# Patient Record
Sex: Female | Born: 1990 | Race: White | Hispanic: No | Marital: Single | Smoking: Never smoker
Health system: Southern US, Community
[De-identification: ages and names within clinical notes are randomized; demographics above are authoritative.]

## PROBLEM LIST (undated history)

## (undated) DIAGNOSIS — Z8619 Personal history of other infectious and parasitic diseases: Secondary | ICD-10-CM

## (undated) DIAGNOSIS — D649 Anemia, unspecified: Secondary | ICD-10-CM

## (undated) DIAGNOSIS — N189 Chronic kidney disease, unspecified: Secondary | ICD-10-CM

## (undated) HISTORY — DX: Personal history of other infectious and parasitic diseases: Z86.19

## (undated) HISTORY — PX: KIDNEY SURGERY: SHX687

---

## 2013-08-26 ENCOUNTER — Emergency Department: Payer: Self-pay | Admitting: Emergency Medicine

## 2013-08-26 LAB — CBC WITH DIFFERENTIAL/PLATELET
BASOS ABS: 0 10*3/uL (ref 0.0–0.1)
BASOS PCT: 0.5 %
Eosinophil #: 0.1 10*3/uL (ref 0.0–0.7)
Eosinophil %: 1.3 %
HCT: 42.5 % (ref 35.0–47.0)
HGB: 14 g/dL (ref 12.0–16.0)
LYMPHS ABS: 1.3 10*3/uL (ref 1.0–3.6)
Lymphocyte %: 26 %
MCH: 29.3 pg (ref 26.0–34.0)
MCHC: 33 g/dL (ref 32.0–36.0)
MCV: 89 fL (ref 80–100)
Monocyte #: 0.8 x10 3/mm (ref 0.2–0.9)
Monocyte %: 16.3 %
Neutrophil #: 2.7 10*3/uL (ref 1.4–6.5)
Neutrophil %: 55.9 %
Platelet: 199 10*3/uL (ref 150–440)
RBC: 4.78 10*6/uL (ref 3.80–5.20)
RDW: 12.6 % (ref 11.5–14.5)
WBC: 4.9 10*3/uL (ref 3.6–11.0)

## 2013-08-26 LAB — URINALYSIS, COMPLETE
Bilirubin,UR: NEGATIVE
GLUCOSE, UR: NEGATIVE mg/dL (ref 0–75)
KETONE: NEGATIVE
Nitrite: NEGATIVE
Ph: 6 (ref 4.5–8.0)
Protein: NEGATIVE
RBC,UR: 33 /HPF (ref 0–5)
SPECIFIC GRAVITY: 1.006 (ref 1.003–1.030)
WBC UR: 43 /HPF (ref 0–5)

## 2013-08-26 LAB — COMPREHENSIVE METABOLIC PANEL
ALK PHOS: 80 U/L
Albumin: 3.8 g/dL (ref 3.4–5.0)
Anion Gap: 7 (ref 7–16)
BILIRUBIN TOTAL: 0.5 mg/dL (ref 0.2–1.0)
BUN: 8 mg/dL (ref 7–18)
CALCIUM: 9 mg/dL (ref 8.5–10.1)
CO2: 26 mmol/L (ref 21–32)
Chloride: 104 mmol/L (ref 98–107)
Creatinine: 1.07 mg/dL (ref 0.60–1.30)
EGFR (African American): >60
EGFR (Non-African Amer.): >60
GLUCOSE: 95 mg/dL (ref 65–99)
OSMOLALITY: 272 (ref 275–301)
POTASSIUM: 3.4 mmol/L — AB (ref 3.5–5.1)
SGOT(AST): 32 U/L (ref 15–37)
SGPT (ALT): 44 U/L
Sodium: 137 mmol/L (ref 136–145)
Total Protein: 8 g/dL (ref 6.4–8.2)

## 2013-08-27 LAB — URINE CULTURE

## 2013-09-15 ENCOUNTER — Ambulatory Visit (INDEPENDENT_AMBULATORY_CARE_PROVIDER_SITE_OTHER): Payer: BC Managed Care – PPO | Admitting: Adult Health

## 2013-09-15 ENCOUNTER — Encounter: Payer: Self-pay | Admitting: Adult Health

## 2013-09-15 VITALS — BP 119/84 | HR 92 | Temp 97.7°F | Resp 14 | Ht 66.0 in | Wt 185.0 lb

## 2013-09-15 DIAGNOSIS — Q600 Renal agenesis, unilateral: Secondary | ICD-10-CM

## 2013-09-15 DIAGNOSIS — Z0000 Encounter for general adult medical examination without abnormal findings: Secondary | ICD-10-CM

## 2013-09-15 DIAGNOSIS — Q605 Renal hypoplasia, unspecified: Secondary | ICD-10-CM

## 2013-09-15 DIAGNOSIS — Q602 Renal agenesis, unspecified: Secondary | ICD-10-CM

## 2013-09-15 DIAGNOSIS — Z3201 Encounter for pregnancy test, result positive: Secondary | ICD-10-CM

## 2013-09-15 DIAGNOSIS — N926 Irregular menstruation, unspecified: Secondary | ICD-10-CM

## 2013-09-15 LAB — COMPREHENSIVE METABOLIC PANEL
ALK PHOS: 66 U/L (ref 39–117)
ALT: 26 U/L (ref 0–35)
AST: 24 U/L (ref 0–37)
Albumin: 4.2 g/dL (ref 3.5–5.2)
BUN: 8 mg/dL (ref 6–23)
CO2: 23 mEq/L (ref 19–32)
CREATININE: 0.8 mg/dL (ref 0.4–1.2)
Calcium: 9.3 mg/dL (ref 8.4–10.5)
Chloride: 101 mEq/L (ref 96–112)
GFR: 94.08 mL/min (ref >60.00)
GLUCOSE: 74 mg/dL (ref 70–99)
Potassium: 4 mEq/L (ref 3.5–5.1)
SODIUM: 132 meq/L — AB (ref 135–145)
Total Bilirubin: 0.6 mg/dL (ref 0.2–1.2)
Total Protein: 7.5 g/dL (ref 6.0–8.3)

## 2013-09-15 LAB — LIPID PANEL
Cholesterol: 221 mg/dL — ABNORMAL HIGH (ref 0–200)
HDL: 50.1 mg/dL (ref >39.00)
LDL CALC: 158 mg/dL — AB (ref 0–99)
NONHDL: 170.9
Total CHOL/HDL Ratio: 4
Triglycerides: 66 mg/dL (ref 0.0–149.0)
VLDL: 13.2 mg/dL (ref 0.0–40.0)

## 2013-09-15 LAB — POCT URINE PREGNANCY: PREG TEST UR: POSITIVE

## 2013-09-15 LAB — CBC WITH DIFFERENTIAL/PLATELET
Basophils Absolute: 0 10*3/uL (ref 0.0–0.1)
Basophils Relative: 0.4 % (ref 0.0–3.0)
EOS PCT: 0.4 % (ref 0.0–5.0)
Eosinophils Absolute: 0 10*3/uL (ref 0.0–0.7)
HCT: 38.3 % (ref 36.0–46.0)
HEMOGLOBIN: 12.9 g/dL (ref 12.0–15.0)
LYMPHS PCT: 19.2 % (ref 12.0–46.0)
Lymphs Abs: 1.3 10*3/uL (ref 0.7–4.0)
MCHC: 33.8 g/dL (ref 30.0–36.0)
MCV: 87.2 fl (ref 78.0–100.0)
MONOS PCT: 6.7 % (ref 3.0–12.0)
Monocytes Absolute: 0.5 10*3/uL (ref 0.1–1.0)
NEUTROS ABS: 5.1 10*3/uL (ref 1.4–7.7)
Neutrophils Relative %: 73.3 % (ref 43.0–77.0)
Platelets: 261 10*3/uL (ref 150.0–400.0)
RBC: 4.39 Mil/uL (ref 3.87–5.11)
RDW: 12.8 % (ref 11.5–15.5)
WBC: 6.9 10*3/uL (ref 4.0–10.5)

## 2013-09-15 LAB — TSH: TSH: 1.89 u[IU]/mL (ref 0.35–4.50)

## 2013-09-15 NOTE — Progress Notes (Signed)
Pre visit review using our clinic review tool, if applicable. No additional management support is needed unless otherwise documented below in the visit note. 

## 2013-09-15 NOTE — Progress Notes (Signed)
Patient ID: Courtney Johnston, female   DOB: [birth year], 23 y.o.   MRN: [id number]   Subjective:    Patient ID: Courtney Johnston, female    DOB: 1990-07-12, 23 y.o.   MRN: [id number]  HPI Patient is a 23 year old female who presents to clinic to establish care. She reports doing a home pregnancy test this morning which was positive. LMP 08/04/13. Does not use any birth control. She is in a relationship with her boyfriend for the past year. Pt has a hx of a single kidney. She was born with a right kidney. 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 Was followed by nephrologist but has not been to see one in greater than 10 years. Pt reports that life got busy and she was feeling fine so did not follow up.    Past Medical History  Diagnosis Date  . History of chicken pox     23yrs old     Past Surgical History  Procedure Laterality Date  . Kidney surgery Right 1990    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 Right kidney surgery for 95% blockage     Family History  Problem Relation Age of Onset  . Arthritis Mother   . Heart disease Mother   . Hypertension Mother   . Hypertension Maternal Aunt   . Heart disease Maternal Uncle   . Hypertension Maternal Uncle   . Heart disease Maternal Grandmother   . Hypertension Maternal Grandmother   . Arthritis Maternal Grandfather   . Hypertension Maternal Grandfather   . Diabetes Paternal Grandfather   . Asthma Father   . Early death Father     Drowning     History   Social History  . Marital Status: Single    Spouse Name: N/A    Number of Children: 0  . Years of Education: 12   Occupational History  . Caregiver    Social History Main Topics  . Smoking status: Never Smoker   . Smokeless tobacco: Not on file  . Alcohol Use: Yes     Comment: occasionally.   . Drug Use: No  . Sexual Activity: Yes    Birth Control/ Protection: None   Other Topics Concern  . Not on file   Social History Narrative   Hobbies: Reading. She enjoys spending time with family   Caffeine: 1 cup of coffee daily   Exercise: once a week. Walking on treadmill    Review of Systems  Constitutional: Negative.   HENT: Negative.   Eyes: Negative.   Respiratory: Negative.   Cardiovascular: Negative.   Gastrointestinal: Negative.   Endocrine: Negative.   Genitourinary: Negative.        LMP 08/04/13  Musculoskeletal: Negative.   Skin: Negative.   Allergic/Immunologic: Negative.   Neurological: Negative.   Hematological: Negative.   Psychiatric/Behavioral: Negative.        Objective:  BP 119/84  Pulse 92  Temp(Src) 97.7 F (36.5 C) (Oral)  Resp 14  Ht  (1.676 m)  Wt 185 lb (83.915 kg)  BMI 29.87 kg/m2  SpO2 99%   Physical Exam  Constitutional: She is oriented to person, place, and time. No distress.  Cardiovascular: Normal rate and regular rhythm.   Pulmonary/Chest: Effort normal. No respiratory distress.  Musculoskeletal: Normal range of motion.  Neurological: She is alert and oriented to person, place, and time.  Skin: Skin is warm and dry.  Psychiatric: She has a normal mood and affect.  Her behavior is normal. Judgment and thought content normal.       Assessment & Plan:   1. Routine general medical examination at a health care facility Normal exam. Check labs. - Comprehensive metabolic panel - TSH - CBC with Differential - Lipid panel  2. Missed period Urine hcg positive. Referr to OB/GYN - POCT urine pregnancy - hCG, serum, qualitative  3. Congenital single kidney She has been feeling well. Has not seen a Nephrologist in over 10 years. She is s/p surgery of right kidney for 95% obstruction in 1990. I'm checking labs. Referral to Nephrology. She needs to be established with nephrologist. - Ambulatory referral to Nephrology  4. Positive pregnancy test Refer to OB/GYN. Congenital single right kidney. - Ambulatory referral to Gynecology

## 2013-09-16 LAB — HCG, SERUM, QUALITATIVE: Preg, Serum: POSITIVE

## 2013-09-26 ENCOUNTER — Other Ambulatory Visit (INDEPENDENT_AMBULATORY_CARE_PROVIDER_SITE_OTHER): Payer: BC Managed Care – PPO

## 2013-09-26 ENCOUNTER — Telehealth: Payer: Self-pay | Admitting: *Deleted

## 2013-09-26 DIAGNOSIS — E871 Hypo-osmolality and hyponatremia: Secondary | ICD-10-CM | POA: Insufficient documentation

## 2013-09-26 NOTE — Telephone Encounter (Signed)
What dx code for sodium?

## 2013-09-26 NOTE — Telephone Encounter (Signed)
Hyponatremia  276.1

## 2013-09-27 LAB — SODIUM: Sodium: 132 mEq/L — ABNORMAL LOW (ref 135–145)

## 2013-10-03 ENCOUNTER — Ambulatory Visit: Payer: BC Managed Care – PPO | Admitting: Adult Health

## 2013-10-19 LAB — OB RESULTS CONSOLE HEPATITIS B SURFACE ANTIGEN: HEP B S AG: NEGATIVE

## 2013-10-19 LAB — OB RESULTS CONSOLE RPR: RPR: NONREACTIVE

## 2013-10-19 LAB — OB RESULTS CONSOLE HIV ANTIBODY (ROUTINE TESTING): HIV: NONREACTIVE

## 2013-10-19 LAB — OB RESULTS CONSOLE RUBELLA ANTIBODY, IGM: Rubella: IMMUNE

## 2013-10-19 LAB — OB RESULTS CONSOLE VARICELLA ZOSTER ANTIBODY, IGG: VARICELLA IGG: IMMUNE

## 2013-10-19 LAB — OB RESULTS CONSOLE GC/CHLAMYDIA
Chlamydia: NEGATIVE
Gonorrhea: NEGATIVE

## 2013-10-19 LAB — OB RESULTS CONSOLE GBS: GBS: POSITIVE

## 2013-10-26 DIAGNOSIS — N181 Chronic kidney disease, stage 1: Secondary | ICD-10-CM | POA: Insufficient documentation

## 2013-11-08 ENCOUNTER — Encounter: Payer: Self-pay | Admitting: Adult Health

## 2014-01-06 NOTE — L&D Delivery Note (Signed)
Operative Delivery Note At 12:49 AM a viable female was delivered via Vaginal, Spontaneous Delivery.  Presentation: vertex; Position: Right,; .  Delivery of the head: 05/11/2014 12:47 AM Tight nuchal cord, unable to be reduced. Attempted to somersault infant through cord. Shoulder dystocia noted and McRoberts and suprapubic pressure applied. After difficulty delivering anterior shoulder, cord was clamped and cut on perineum and infant delivered quickly after. Infant to bedside warmer immediately for resuscitation.   APGAR: 4, 7; weight 8 lb 5.7 oz (3790 g).   Placenta status: Intact, Spontaneous.   Cord: 3 vessels with the following complications: None.  Cord pH: 7.29  Anesthesia: Epidural  Episiotomy: None Lacerations: 2nd degree;Perineal Suture Repair: 2.0 vicryl Est. Blood Loss (mL): 400  Mom to postpartum.  Baby to Couplet care / Skin to Skin.  Courtney Johnston, Courtney Johnston 05/11/2014, 3:37 AM

## 2014-01-26 ENCOUNTER — Encounter: Payer: Self-pay | Admitting: Obstetrics & Gynecology

## 2014-05-07 NOTE — Consult Note (Signed)
Referral Information:  Reason for Referral Maternal Solitary Kidney in pregnancy   Referring Physician [facility]   Prenatal Hx Courtney Johnston is a 24 year-old G1 P0 at 4225 0/7 weeks (EDC 05/10/13) who presents for recommendations of care in her pregnancy.  Courtney Johnston was born with an absent left kidney. 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 Since then, she has had no renal issues.  Her pregnancy has gone well. [facility] and that two fetal kidneys, both in the correct location, were seen. She denies any right flank pain, abdominal pain, fever or urine symtpoms.  She is [facility] Est[facility] Bakes(Lindsay Kruska).  She had kidney function tests in September that were normal and a renal US in October that showed moderate right-sided hydronephrosis but normal-appearing renal parenchyma.   Past Obstetrical Hx G1 P0   Home Medications: Medication Instructions Status  multivitamin, prenatal 1   once a day Active   Allergies:   No Known Allergies:   Vital Signs/Notes:  Nursing Vital Signs: **Vital Signs.:   21-Jan-16 10:47  Temperature Temperature (F) 97.7  Pulse Pulse 114  Systolic BP Systolic BP 136  Diastolic BP (mmHg) Diastolic BP (mmHg) 84  Pulse Ox % Pulse Ox % 99   Perinatal Consult:  PGyn Hx Never been worked up for [employer] Inc[employer]ellar[employer] abnormalities.  Reporst single cervix seen on GYn exams. Denies history of abnormal paps or STDs   Past Medical History cont'd Renal history as in HPI Denies history of hypertension, diabetes or endocrine disoders.   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 See HPI   Abdominal Pain No   Nausea/Vomiting No   SOB/DOE No   Chest Pain No    Additional Lab/Radiology Notes Prenatal labs ([facility], 10/20/13)):  Blood type O negative, antibody screen negative, RPR non-reactive, Hep B negative, HIV non-reactive, Rubella immune, Varicella immune, Hct 35.9, MCV 86, BUN 9, Cr 0.8, GBS positive (urine culture, treated), GC/Chl neg, AST 15, ALT 14, pap normal Tetra screen (11/25/13): Normal  Renal US (10/26/13): Absent left kidney. Right kidney with moderate hydronephrosis with normal-appearing parenchyma   Impression/Recommendations:  Impression 24 year-old G1 P0 at 5425 0/7 weeks with maternal single kidney, Rh negative, and GBS baceturia.   Recommendations 1. Rh negative. Her antibody screen was negative. Please provide Rhogam at 28 weeks and recheck her antibody screen at that time.  2. GBS bacteruia. Her Urine was treated. Please re-treat in labor  3. Maternal single kidney.  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 Currently, she has moderate hydronephrosis of her remaining right kidney, which may be due to the pregnancy, and normal serum renal function. Her urine protein dips with [facility] have been normal.  With a single kidney but normal renal function, it is unknown if her risk for preeclampsia would be greater than her baseline risk.  I have offered a daily baby asprin, if OK with her nephrologist, to decrease her risk of preeclampsia. We discussed that severe  cases of preeclampsia, though rare, can result in renal failure.  In addition, we discussed signs and symptoms of pyelonephritis and cystitis. Asymptomatic bacteruria should be contineud to be screened for and aggressively treated as pregnancy increases her risk for renal infection.  -Rhogam at 28 weeks -Treat GBS in labor -Continue to screen for asymptomatic  bacturia and treat -We discussed signs of renal infection or obstruction. She knows to call with any symptoms (pain, fever, dysuria, hematuria, etc) -Consider daily baby aspirin -Check urine P/C -GYN Ultrasound following delivery to screen for Muellarian abnormalities.  Should she have an undiagnosed unicornuate uterus, she is at risk for preterm labor and/or malpresentation. Obstetric US does a poor job in IT trainer for uterine anomolies.    Total Time Spent with Patient 60 minutes   >50% of visit spent in couseling/coordination of care yes   Office Use Only 99244  Level 4 ( ) NEW office consult low complexity   Coding Description: OTHER: Maternal single kidney.  Electronic Signatures: Jahari Wiginton, [place] (MD)  (Signed 21-Jan-16 14:16)  Authored: Referral, Home Medications, Allergies, Vital Signs/Notes, Consult, Exam, Lab/Radiology Notes, Impression, Billing, Coding Description   Last Updated: 21-Jan-16 14:16 by Jackson Coffield, [place] (MD)

## 2014-05-09 ENCOUNTER — Encounter: Payer: Self-pay | Admitting: *Deleted

## 2014-05-09 ENCOUNTER — Inpatient Hospital Stay
Admission: EM | Admit: 2014-05-09 | Discharge: 2014-05-12 | DRG: 775 | Disposition: A | Discharge status: Home / Self Care | Payer: BLUE CROSS/BLUE SHIELD | Attending: Obstetrics and Gynecology | Admitting: Obstetrics and Gynecology

## 2014-05-09 DIAGNOSIS — Z7982 Long term (current) use of aspirin: Secondary | ICD-10-CM

## 2014-05-09 DIAGNOSIS — Z825 Family history of asthma and other chronic lower respiratory diseases: Secondary | ICD-10-CM

## 2014-05-09 DIAGNOSIS — O1490 Unspecified pre-eclampsia, unspecified trimester: Secondary | ICD-10-CM | POA: Diagnosis present

## 2014-05-09 DIAGNOSIS — Z833 Family history of diabetes mellitus: Secondary | ICD-10-CM | POA: Diagnosis not present

## 2014-05-09 DIAGNOSIS — Z905 Acquired absence of kidney: Secondary | ICD-10-CM | POA: Diagnosis present

## 2014-05-09 DIAGNOSIS — Z3A39 39 weeks gestation of pregnancy: Secondary | ICD-10-CM | POA: Diagnosis present

## 2014-05-09 DIAGNOSIS — O163 Unspecified maternal hypertension, third trimester: Secondary | ICD-10-CM | POA: Diagnosis present

## 2014-05-09 DIAGNOSIS — O133 Gestational [pregnancy-induced] hypertension without significant proteinuria, third trimester: Secondary | ICD-10-CM | POA: Diagnosis present

## 2014-05-09 DIAGNOSIS — Z8249 Family history of ischemic heart disease and other diseases of the circulatory system: Secondary | ICD-10-CM

## 2014-05-09 DIAGNOSIS — Z79899 Other long term (current) drug therapy: Secondary | ICD-10-CM

## 2014-05-09 HISTORY — DX: Anemia, unspecified: D64.9

## 2014-05-09 LAB — COMPREHENSIVE METABOLIC PANEL
ALT: 15 U/L (ref 14–54)
AST: 26 U/L (ref 15–41)
Albumin: 3.3 g/dL — ABNORMAL LOW (ref 3.5–5.0)
Alkaline Phosphatase: 112 U/L (ref 38–126)
Anion gap: 10 (ref 5–15)
BUN: 7 mg/dL (ref 6–20)
CALCIUM: 9 mg/dL (ref 8.9–10.3)
CO2: 20 mmol/L — ABNORMAL LOW (ref 22–32)
Chloride: 108 mmol/L (ref 101–111)
Creatinine, Ser: 0.57 mg/dL (ref 0.44–1.00)
GFR calc Af Amer: >60 mL/min (ref >60)
GFR calc non Af Amer: >60 mL/min (ref >60)
GLUCOSE: 91 mg/dL (ref 65–99)
Potassium: 4.1 mmol/L (ref 3.5–5.1)
SODIUM: 138 mmol/L (ref 135–145)
TOTAL PROTEIN: 6.8 g/dL (ref 6.5–8.1)
Total Bilirubin: 0.4 mg/dL (ref 0.3–1.2)

## 2014-05-09 LAB — TYPE AND SCREEN
ABO/RH(D): O NEG
ANTIBODY SCREEN: NEGATIVE

## 2014-05-09 LAB — CBC
HCT: 35.8 % (ref 35.0–47.0)
Hemoglobin: 12.2 g/dL (ref 12.0–16.0)
MCH: 30.5 pg (ref 26.0–34.0)
MCHC: 34 g/dL (ref 32.0–36.0)
MCV: 89.8 fL (ref 80.0–100.0)
PLATELETS: 243 10*3/uL (ref 150–440)
RBC: 3.98 MIL/uL (ref 3.80–5.20)
RDW: 14 % (ref 11.5–14.5)
WBC: 10.1 10*3/uL (ref 3.6–11.0)

## 2014-05-09 LAB — PROTEIN / CREATININE RATIO, URINE
Creatinine, Urine: 50 mg/dL
PROTEIN CREATININE RATIO: 0.14 mg/mg{creat} (ref 0.00–0.15)
TOTAL PROTEIN, URINE: 7 mg/dL

## 2014-05-09 MED ORDER — ONDANSETRON HCL 4 MG/2ML IJ SOLN: 4.0000 mg | Freq: Four times a day (QID) | INTRAMUSCULAR | Status: DC | PRN

## 2014-05-09 MED ORDER — AMPICILLIN SODIUM 2 G IJ SOLR
INTRAMUSCULAR | Status: AC
  Administered 2014-05-09: 16:00:00
  Filled 2014-05-09: qty 2000

## 2014-05-09 MED ORDER — SODIUM CHLORIDE 0.9 % IV SOLN
2.0000 g | Freq: Once | INTRAVENOUS | Status: AC
  Administered 2014-05-09: 2 g via INTRAVENOUS

## 2014-05-09 MED ORDER — LIDOCAINE HCL (PF) 1 % IJ SOLN
30.0000 mL | INTRAMUSCULAR | Status: DC | PRN
  Filled 2014-05-09: qty 30

## 2014-05-09 MED ORDER — SODIUM CHLORIDE 0.9 % IV SOLN
1.0000 g | INTRAVENOUS | Status: DC
  Administered 2014-05-09 – 2014-05-10 (×7): 1 g via INTRAVENOUS
  Filled 2014-05-09 (×3): qty 1000

## 2014-05-09 MED ORDER — OXYTOCIN 40 UNITS IN LACTATED RINGERS INFUSION - SIMPLE MED
INTRAVENOUS | Status: AC
  Filled 2014-05-09: qty 1000

## 2014-05-09 MED ORDER — ZOLPIDEM TARTRATE 5 MG PO TABS
10.0000 mg | ORAL_TABLET | Freq: Every evening | ORAL | Status: AC | PRN
  Administered 2014-05-09: 10 mg via ORAL

## 2014-05-09 MED ORDER — MISOPROSTOL 200 MCG PO TABS: 800.0000 ug | ORAL_TABLET | Freq: Once | ORAL | Status: AC | PRN

## 2014-05-09 MED ORDER — AMMONIA AROMATIC IN INHA: 0.3000 mL | Freq: Once | RESPIRATORY_TRACT | Status: AC | PRN

## 2014-05-09 MED ORDER — OXYTOCIN BOLUS FROM INFUSION: 500.0000 mL | INTRAVENOUS | Status: DC

## 2014-05-09 MED ORDER — TERBUTALINE SULFATE 1 MG/ML IJ SOLN: 0.2500 mg | Freq: Once | INTRAMUSCULAR | Status: AC | PRN

## 2014-05-09 MED ORDER — ACETAMINOPHEN 325 MG PO TABS
650.0000 mg | ORAL_TABLET | ORAL | Status: DC | PRN
  Administered 2014-05-10: 650 mg via ORAL

## 2014-05-09 MED ORDER — MISOPROSTOL 200 MCG PO TABS
ORAL_TABLET | ORAL | Status: AC
  Filled 2014-05-09: qty 4

## 2014-05-09 MED ORDER — SODIUM CHLORIDE 0.9 % IV SOLN
INTRAVENOUS | Status: AC
  Administered 2014-05-09: 1 g via INTRAVENOUS
  Filled 2014-05-09: qty 1000

## 2014-05-09 MED ORDER — OXYTOCIN 10 UNIT/ML IJ SOLN: 10.0000 [IU] | Freq: Once | INTRAMUSCULAR | Status: AC | PRN

## 2014-05-09 MED ORDER — LIDOCAINE HCL (PF) 1 % IJ SOLN
INTRAMUSCULAR | Status: AC
  Filled 2014-05-09: qty 30

## 2014-05-09 MED ORDER — LACTATED RINGERS IV SOLN
INTRAVENOUS | Status: DC
  Administered 2014-05-09 – 2014-05-10 (×3): via INTRAVENOUS

## 2014-05-09 MED ORDER — AMMONIA AROMATIC IN INHA
RESPIRATORY_TRACT | Status: AC
  Filled 2014-05-09: qty 10

## 2014-05-09 MED ORDER — OXYTOCIN 10 UNIT/ML IJ SOLN
INTRAMUSCULAR | Status: AC
  Filled 2014-05-09: qty 2

## 2014-05-09 MED ORDER — CITRIC ACID-SODIUM CITRATE 334-500 MG/5ML PO SOLN
30.0000 mL | ORAL | Status: DC | PRN
  Filled 2014-05-09: qty 30

## 2014-05-09 MED ORDER — LACTATED RINGERS IV SOLN: 500.0000 mL | INTRAVENOUS | Status: DC | PRN

## 2014-05-09 MED ORDER — OXYTOCIN 40 UNITS IN LACTATED RINGERS INFUSION - SIMPLE MED: 62.5000 mL/h | INTRAVENOUS | Status: DC

## 2014-05-09 MED ORDER — ZOLPIDEM TARTRATE 5 MG PO TABS
ORAL_TABLET | ORAL | Status: AC
  Administered 2014-05-09: 10 mg via ORAL
  Filled 2014-05-09: qty 2

## 2014-05-09 MED ORDER — DINOPROSTONE 10 MG VA INST
10.0000 mg | VAGINAL_INSERT | Freq: Once | VAGINAL | Status: AC
  Administered 2014-05-09: 10 mg via VAGINAL
  Filled 2014-05-09: qty 1

## 2014-05-09 NOTE — H&P (Signed)
Obstetric H&P   Chief Complaint: Elevated blood pressure reading at term  Prenatal Care Provider: [facility]  History of Present Illness: 24 y.o. G1P0 at [redacted]w[redacted]d by [facility] of 05/11/2014 presenting to L&D after elevated BP reading in clinic during ROB visit (140/90 with repeat 168/110).  Protein dip in clinic was negative.  No headaches, vision changes, RUQ/epigastric pain, no increased edema (weight stable from last week).  +FM, no LOF, no VB, no ctx.   PNC is noteable for history of congenital absence of there left kidney, [facility]  Was seen and evaluated by [facility].   TDAP received 03/01/14  ABO, Rh:O negative Antibody: ABSC neg Rubella: Immune RPR: Immune HBsAg: negative HIV: negative GBS: + Bacteruria  Review of Systems: 10 point review of systems negative unless otherwise noted in HPI  Past Medical History: Past Medical History  Diagnosis Date  . History of chicken pox     24yrs old    Past Surgical History: Past Surgical History  Procedure Laterality Date  . Kidney surgery Right 1990    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 Right kidney surgery for 95% blockage    Past Obstetric History:  Past Gynecologic History:  Family History: Family History  Problem Relation Age of Onset  . Arthritis Mother   . Heart disease Mother   . Hypertension Mother   . Hypertension Maternal Aunt   . Heart disease Maternal Uncle   . Hypertension Maternal Uncle   . Heart disease Maternal Grandmother   . Hypertension Maternal Grandmother   . Arthritis Maternal Grandfather   . Hypertension Maternal Grandfather   . Diabetes Paternal Grandfather   . Asthma Father   . Early death Father     Drowning    Social History: History   Social History  . Marital Status: Single    Spouse Name: N/A  . Number of Children: 0  . Years of Education: 12   Occupational History  . Caregiver    Social History Main Topics  . Smoking status:  Never Smoker   . Smokeless tobacco: Not on file  . Alcohol Use: Yes     Comment: occasionally.   . Drug Use: No  . Sexual Activity: Yes    Birth Control/ Protection: None   Other Topics Concern  . Not on file   Social History Narrative   Hobbies: Reading. She enjoys spending time with family   Caffeine: 1 cup of coffee daily   Exercise: once a week. Walking on treadmill    Medications: Prior to Admission medications   Medication Sig Start Date End Date Taking? Authorizing Provider  aspirin 81 MG tablet Take 81 mg by mouth daily.   Yes Historical Provider, MD  ferrous fumarate (HEMOCYTE - 106 MG FE) 325 (106 FE) MG TABS tablet Take 1 tablet by mouth.   Yes Historical Provider, MD  Prenatal Vit-Fe Fumarate-FA (MULTIVITAMIN-PRENATAL) 27-0.8 MG TABS tablet Take 1 tablet by mouth daily at 12 noon.   Yes Historical Provider, MD  ranitidine (ZANTAC) 75 MG tablet Take 75 mg by mouth 2 (two) times daily.   Yes Historical Provider, MD    Allergies: No Known Allergies  Physical Exam: Vitals: There were no vitals taken for this visit.  Urine Dip Protein: P/C ratio pending  FHT: 150, moderate, positive accels, no decels Toco: absent  General: NAD HEENT: Normocephalic, anicteric Pulmonary: No increased work of breathing Cardiovascular: RRR Abdomen: Gravid, non-tender, non-distended Leopolds: vertex (confirmed  on bedside ultrasound), 8lbs Genitourinary: 1/50%/-3, intact  Extremities:  Labs: No results found for this or any previous visit (from the past 24 hour(s)).  Assessment: 24 y.o. G1P0 at 851w5d presenting with Preeclampsia without severe features vs GHTN Plan: 1) Elevated blood pressures at term - discussed recommendation is to proceed with delivery - cervidil  - CMP, CBC, and P/C ratio ordered  2) Fetus - - untested pelvis - 36lbs weight gain this pregnancy - 1-hr OGTT 96  3) PNL - O neg / ABSC neg / RI / VZI / HIV neg / HBsAg neg / RPR NR / 1-hr 96 / GBS positive  bacteruria  4) TDAP - 03/01/13  5) Congenital absence left kidney - baseline Cr 0.8, P/C ratio 114  6) Disposition - pending delivery

## 2014-05-09 NOTE — Plan of Care (Signed)
Problem: Consults Goal: Birthing Suites Patient Information Press F2 to bring up selections list Outcome: Progressing  Pt 37-[redacted] weeks EGA and PIH (Pregnancy induced hypertension)

## 2014-05-10 ENCOUNTER — Inpatient Hospital Stay: Payer: BLUE CROSS/BLUE SHIELD | Admitting: Anesthesiology

## 2014-05-10 ENCOUNTER — Encounter: Payer: Self-pay | Admitting: Anesthesiology

## 2014-05-10 LAB — RPR: RPR: NONREACTIVE

## 2014-05-10 MED ORDER — DIPHENHYDRAMINE HCL 50 MG/ML IJ SOLN: 12.5000 mg | INTRAMUSCULAR | Status: DC | PRN

## 2014-05-10 MED ORDER — ACETAMINOPHEN 325 MG PO TABS
ORAL_TABLET | ORAL | Status: AC
  Filled 2014-05-10: qty 2

## 2014-05-10 MED ORDER — FENTANYL 2.5 MCG/ML W/ROPIVACAINE 0.2% IN NS 100 ML EPIDURAL INFUSION (ARMC-ANES)
EPIDURAL | Status: AC
  Administered 2014-05-10: 10 mL/h via EPIDURAL
  Filled 2014-05-10: qty 100

## 2014-05-10 MED ORDER — TERBUTALINE SULFATE 1 MG/ML IJ SOLN: 0.2500 mg | Freq: Once | INTRAMUSCULAR | Status: AC | PRN

## 2014-05-10 MED ORDER — PHENYLEPHRINE 40 MCG/ML (10ML) SYRINGE FOR IV PUSH (FOR BLOOD PRESSURE SUPPORT)
80.0000 ug | PREFILLED_SYRINGE | INTRAVENOUS | Status: DC | PRN
Start: 2014-05-10 — End: 2014-05-10

## 2014-05-10 MED ORDER — SODIUM CHLORIDE 0.9 % IV SOLN
INTRAVENOUS | Status: AC
  Administered 2014-05-10: 1 g via INTRAVENOUS
  Filled 2014-05-10: qty 1000

## 2014-05-10 MED ORDER — FENTANYL 2.5 MCG/ML W/ROPIVACAINE 0.2% IN NS 100 ML EPIDURAL INFUSION (ARMC-ANES)
10.0000 mL/h | EPIDURAL | Status: DC
  Administered 2014-05-10: 10 mL/h via EPIDURAL

## 2014-05-10 MED ORDER — EPHEDRINE 5 MG/ML INJ
10.0000 mg | INTRAVENOUS | Status: DC | PRN
  Filled 2014-05-10: qty 2

## 2014-05-10 MED ORDER — FENTANYL 2.5 MCG/ML W/ROPIVACAINE 0.2% IN NS 100 ML EPIDURAL INFUSION (ARMC-ANES): 10.0000 mL/h | EPIDURAL | Status: DC

## 2014-05-10 MED ORDER — EPHEDRINE 5 MG/ML INJ
10.0000 mg | INTRAVENOUS | Status: DC | PRN
Start: 2014-05-10 — End: 2014-05-10

## 2014-05-10 MED ORDER — FENTANYL 2.5 MCG/ML BUPIVACAINE 1/10 % EPIDURAL INFUSION (WH - ANES): 10.0000 mL/h | INTRAMUSCULAR | Status: DC | PRN

## 2014-05-10 MED ORDER — PHENYLEPHRINE 40 MCG/ML (10ML) SYRINGE FOR IV PUSH (FOR BLOOD PRESSURE SUPPORT)
80.0000 ug | PREFILLED_SYRINGE | INTRAVENOUS | Status: DC | PRN
  Filled 2014-05-10: qty 2

## 2014-05-10 MED ORDER — OXYTOCIN 40 UNITS IN LACTATED RINGERS INFUSION - SIMPLE MED
1.0000 m[IU]/min | INTRAVENOUS | Status: DC
  Administered 2014-05-10: 15 m[IU]/min via INTRAVENOUS
  Administered 2014-05-10: 17 m[IU]/min via INTRAVENOUS
  Administered 2014-05-10: 9 m[IU]/min via INTRAVENOUS
  Administered 2014-05-10: 11 m[IU]/min via INTRAVENOUS
  Administered 2014-05-10: 1 m[IU]/min via INTRAVENOUS
  Administered 2014-05-10: 15 m[IU]/min via INTRAVENOUS
  Administered 2014-05-10: 19 m[IU]/min via INTRAVENOUS
  Administered 2014-05-10: 13 m[IU]/min via INTRAVENOUS

## 2014-05-10 MED ORDER — OXYTOCIN 40 UNITS IN LACTATED RINGERS INFUSION - SIMPLE MED: 1.0000 m[IU]/min | INTRAVENOUS | Status: DC

## 2014-05-10 MED ORDER — SODIUM CHLORIDE 0.9 % IV SOLN
INTRAVENOUS | Status: AC
  Filled 2014-05-10: qty 1000

## 2014-05-10 MED ORDER — BUPIVACAINE HCL (PF) 0.25 % IJ SOLN
INTRAMUSCULAR | Status: DC | PRN
  Administered 2014-05-10: 5 mL via EPIDURAL

## 2014-05-10 MED ORDER — LIDOCAINE-EPINEPHRINE (PF) 1.5 %-1:200000 IJ SOLN
INTRAMUSCULAR | Status: DC | PRN
  Administered 2014-05-10: 4 mL

## 2014-05-10 NOTE — Anesthesia Preprocedure Evaluation (Signed)
Anesthesia Evaluation  Patient identified by MRN, date of birth, ID band Patient awake    Reviewed: Allergy & Precautions, H&P , NPO status , Patient's Chart, lab work & pertinent test results, reviewed documented beta blocker date and time   Airway Mallampati: II  TM Distance: >3 FB Neck ROM: full    Dental no notable dental hx.    Pulmonary neg pulmonary ROS,  breath sounds clear to auscultation  Pulmonary exam normal       Cardiovascular Exercise Tolerance: Good negative cardio ROS  Rhythm:regular Rate:Normal     Neuro/Psych negative neurological ROS  negative psych ROS   GI/Hepatic negative GI ROS, Neg liver ROS,   Endo/Other  negative endocrine ROS  Renal/GU negative Renal ROS  negative genitourinary   Musculoskeletal   Abdominal   Peds  Hematology negative hematology ROS (+) anemia ,   Anesthesia Other Findings   Reproductive/Obstetrics (+) Pregnancy                             Anesthesia Physical Anesthesia Plan  ASA: II  Anesthesia Plan: Regional and Epidural   Post-op Pain Management:    Induction:   Airway Management Planned:   Additional Equipment:   Intra-op Plan:   Post-operative Plan:   Informed Consent: I have reviewed the patients History and Physical, chart, labs and discussed the procedure including the risks, benefits and alternatives for the proposed anesthesia with the patient or authorized representative who has indicated his/her understanding and acceptance.     Plan Discussed with: Anesthesiologist  Anesthesia Plan Comments:         Anesthesia Quick Evaluation

## 2014-05-10 NOTE — Plan of Care (Signed)
Problem: Consults Goal: Birthing Suites Patient Information Press F2 to bring up selections list  Outcome: Progressing  Pt 37-[redacted] weeks EGA, Inpatient induction and PIH (Pregnancy induced hypertension)

## 2014-05-10 NOTE — Anesthesia Procedure Notes (Signed)
Epidural Patient location during procedure: OB Start time: 05/10/2014 5:25 PM  Staffing Performed by: anesthesiologist   Preanesthetic Checklist Completed: patient identified, site marked, surgical consent, pre-op evaluation, timeout performed, IV checked, risks and benefits discussed and monitors and equipment checked  Epidural Patient position: sitting Prep: Betadine Patient monitoring: heart rate, continuous pulse ox and blood pressure Approach: midline Location: L4-L5 Injection technique: LOR air  Needle:  Needle type: Tuohy  Needle gauge: 18 G Needle length: 9 cm and 9 Needle insertion depth: 5 cm Catheter type: closed end flexible Catheter size: 20 Guage Catheter at skin depth: 10 cm Test dose: negative and 1.5% lidocaine with Epi 1:200 K  Assessment Sensory level: T10 Events: blood not aspirated, injection not painful, no injection resistance, negative IV test and no paresthesia  Additional Notes Pt's history reviewed and consent obtained as per OB consent Patient tolerated the insertion well without complications. Negative SATD, negative IVTD All VSS were obtained and monitored through OBIX and nursing protocols followed.Reason for block:procedure for pain

## 2014-05-10 NOTE — Anesthesia Preprocedure Evaluation (Signed)
Anesthesia Evaluation  Patient identified by MRN, date of birth, ID band Patient awake    Reviewed: Allergy & Precautions, NPO status , Patient's Chart, lab work & pertinent test results  Airway Mallampati: II  TM Distance: <3 FB     Dental  (+) Teeth Intact   Pulmonary neg pulmonary ROS,          Cardiovascular negative cardio ROS  Rhythm:regular Rate:Normal     Neuro/Psych negative neurological ROS  negative psych ROS   GI/Hepatic negative GI ROS, Neg liver ROS,   Endo/Other  negative endocrine ROS  Renal/GU negative Renal ROS  negative genitourinary   Musculoskeletal negative musculoskeletal ROS (+)   Abdominal   Peds negative pediatric ROS (+)  Hematology negative hematology ROS (+) anemia ,   Anesthesia Other Findings   Reproductive/Obstetrics negative OB ROS                             Anesthesia Physical Anesthesia Plan  ASA: II  Anesthesia Plan: Epidural   Post-op Pain Management:    Induction:   Airway Management Planned:   Additional Equipment:   Intra-op Plan:   Post-operative Plan:   Informed Consent: I have reviewed the patients History and Physical, chart, labs and discussed the procedure including the risks, benefits and alternatives for the proposed anesthesia with the patient or authorized representative who has indicated his/her understanding and acceptance.     Plan Discussed with: Anesthesiologist  Anesthesia Plan Comments:         Anesthesia Quick Evaluation

## 2014-05-10 NOTE — Progress Notes (Signed)
Courtney Johnston is a 24 y.o. G1P0 at 428w6d by ultrasound admitted for induction of labor due to pre-eclampsia without severe features.  Subjective: Pt reports feeling a gush of fluid. Pt having some pain but is manageable.   Objective: BP 138/78 mmHg  Pulse 86  Temp(Src) 98.1 F (36.7 C) (Oral)  Resp 20  Ht 5\' 6"  (1.676 m)  Wt 99.338 kg (219 lb)  BMI 35.36 kg/m2  LMP 08/04/2013   Total I/O In: 1399.2 [I.V.:1299.2; IV Piggyback:100] Out: -   FHT:  FHR: 140s bpm, variability: moderate,  accelerations:  Present,  decelerations:  Absent UC:   regular, every 2 minutes SVE:   Dilation: 3 Effacement (%): 90 Station: -1 Exam by:: CMS,CNM  SROM with clear fluid   Labs: Lab Results  Component Value Date   WBC 10.1 05/09/2014   HGB 12.2 05/09/2014   HCT 35.8 05/09/2014   MCV 89.8 05/09/2014   PLT 243 05/09/2014     Assessment / Plan: Induction of labor due to preeclampsia,  progressing well on pitocin  Labor: progressing on pitocin- currently on 15 Preeclampsia:  no signs or symptoms of toxicity and labs stable Fetal Wellbeing:  Category I Pain Control:  plans epidural  I/D:  abx for GBS bacteriuria  Anticipated MOD:  NSVD  [name],  [name] 05/10/2014, 4:31 PM

## 2014-05-10 NOTE — Progress Notes (Signed)
Patient ID: Courtney Johnston, female   DOB: [birth year], 24 y.o.   MRN: [id number]  L&D Note  05/10/2014 - 6:42 PM  23 y.o. G1P0 5946w6d . P[facility]  Ms. Erick Ardelle BallsMichelle Bensman is admitted for IOL for GHTN     Subjective:  Comfortable after epidural    Objective:   Filed Vitals:   05/10/14 1810 05/10/14 1815 05/10/14 1817 05/10/14 1820  BP:   120/63   Pulse:   79   Temp:      TempSrc:      Resp:      Height:      Weight:      SpO2: 99% 99%  100%    Current Vital Signs 24h Vital Sign Ranges  T 98.3 F (36.8 C) Temp  Avg: 98.2 F (36.8 C)  Min: 97.9 F (36.6 C)  Max: 98.6 F (37 C)  BP 120/63 mmHg BP  Min: 102/47  Max: 155/87  HR 79 Pulse  Avg: 90.1  Min: 75  Max: 111  RR 20 Resp  Avg: 18  Min: 16  Max: 20  SaO2 100 %   SpO2  Avg: 99.3 %  Min: 99 %  Max: 100 %       24 Hour I/O Current Shift I/O  Time Ins Outs   05/04 0701 - 05/04 1900 In: 1628.4 [I.V.:1528.4] Out: -     FHR: Cat 1, baseline 140, + accels, no decels  Toco: q 2-3 min  SVE: 4/100/-2   Assessment :  IUP at 6746w6d, IOL for GHTN     Plan:  IUPC placed - titrate Pitocin for adequate MVUs Will recheck in 2 hours if MVUs adequate  Marta AntuBrothers, Saleen Peden, [place]

## 2014-05-10 NOTE — Progress Notes (Signed)
Courtney Johnston is a 24 y.o. G1P0 at 5075w6d admitted for induction of labor due to gestational Hypertension.  Subjective:   Objective: BP 95/52 mmHg  Pulse 103  Temp(Src) 97.9 F (36.6 C) (Oral)  Resp 20  Ht 5\' 6"  (1.676 m)  Wt 99.338 kg (219 lb)  BMI 35.36 kg/m2  SpO2 100%  LMP 08/04/2013 I/O last 3 completed shifts: In: 1719.7 [I.V.:1569.7; IV Piggyback:150] Out: -     FHT:  FHR: 150 bpm, variability: moderate,  accelerations:  Present,  decelerations:  Absent UC:   regular, every 2.5 minutes, MVUs > 200 SVE:   Dilation: 5 Effacement (%): 80 Station: -2 Exam by::  (Courtney Johnston, CNM)  Labs: Lab Results  Component Value Date   WBC 10.1 05/09/2014   HGB 12.2 05/09/2014   HCT 35.8 05/09/2014   MCV 89.8 05/09/2014   PLT 243 05/09/2014    Assessment / Plan: Induction of labor due to gestational hypertension,  progressing well on pitocin  Labor: Progressing normally Fetal Wellbeing:  Category I Pain Control:  Epidural Anticipated MOD:  NSVD  Courtney Johnston, Courtney Johnston 05/10/2014, 8:52 PM

## 2014-05-11 LAB — CORD BLOOD GAS (ARTERIAL)
Acid-base deficit: 3.1 mmol/L — ABNORMAL HIGH (ref 0.0–2.0)
Bicarbonate: 24 meq/L (ref 21.0–28.0)
pCO2 cord blood (arterial): 50 mmHg (ref 42.0–56.0)
pH cord blood (arterial): 7.29 (ref 7.210–7.380)

## 2014-05-11 MED ORDER — MAGNESIUM HYDROXIDE 400 MG/5ML PO SUSP: 30.0000 mL | ORAL | Status: DC | PRN

## 2014-05-11 MED ORDER — ACETAMINOPHEN 325 MG PO TABS
650.0000 mg | ORAL_TABLET | ORAL | Status: DC | PRN
Start: 2014-05-11 — End: 2014-05-12

## 2014-05-11 MED ORDER — PRENATAL MULTIVITAMIN CH
1.0000 | ORAL_TABLET | Freq: Every day | ORAL | Status: DC
  Administered 2014-05-11 – 2014-05-12 (×2): 1 via ORAL
  Filled 2014-05-11 (×2): qty 1

## 2014-05-11 MED ORDER — BENZOCAINE-MENTHOL 20-0.5 % EX AERO: 1.0000 "application " | INHALATION_SPRAY | CUTANEOUS | Status: DC | PRN

## 2014-05-11 MED ORDER — LANOLIN HYDROUS EX OINT: TOPICAL_OINTMENT | CUTANEOUS | Status: DC | PRN

## 2014-05-11 MED ORDER — ONDANSETRON HCL 4 MG/2ML IJ SOLN
4.0000 mg | INTRAMUSCULAR | Status: DC | PRN
Start: 2014-05-11 — End: 2014-05-12

## 2014-05-11 MED ORDER — ONDANSETRON HCL 4 MG PO TABS: 4.0000 mg | ORAL_TABLET | ORAL | Status: DC | PRN

## 2014-05-11 MED ORDER — FERROUS SULFATE 325 (65 FE) MG PO TABS
325.0000 mg | ORAL_TABLET | Freq: Every day | ORAL | Status: DC
  Administered 2014-05-11 – 2014-05-12 (×2): 325 mg via ORAL
  Filled 2014-05-11 (×3): qty 1

## 2014-05-11 MED ORDER — SIMETHICONE 80 MG PO CHEW: 80.0000 mg | CHEWABLE_TABLET | ORAL | Status: DC | PRN

## 2014-05-11 MED ORDER — LACTATED RINGERS IV SOLN
INTRAVENOUS | Status: DC
  Administered 2014-05-11: 06:00:00 via INTRAVENOUS

## 2014-05-11 MED ORDER — WITCH HAZEL-GLYCERIN EX PADS: 1.0000 "application " | MEDICATED_PAD | CUTANEOUS | Status: DC | PRN

## 2014-05-11 MED ORDER — ZOLPIDEM TARTRATE 5 MG PO TABS: 5.0000 mg | ORAL_TABLET | Freq: Every evening | ORAL | Status: DC | PRN

## 2014-05-11 MED ORDER — DIPHENHYDRAMINE HCL 25 MG PO CAPS: 25.0000 mg | ORAL_CAPSULE | Freq: Four times a day (QID) | ORAL | Status: DC | PRN

## 2014-05-11 MED ORDER — DIBUCAINE 1 % RE OINT
1.0000 "application " | TOPICAL_OINTMENT | RECTAL | Status: DC | PRN
  Filled 2014-05-11: qty 28

## 2014-05-11 MED ORDER — DOCUSATE SODIUM 100 MG PO CAPS: 100.0000 mg | ORAL_CAPSULE | Freq: Two times a day (BID) | ORAL | Status: DC | PRN

## 2014-05-11 MED ORDER — IBUPROFEN 600 MG PO TABS
600.0000 mg | ORAL_TABLET | Freq: Four times a day (QID) | ORAL | Status: DC
  Administered 2014-05-11 – 2014-05-12 (×5): 600 mg via ORAL
  Filled 2014-05-11 (×5): qty 1

## 2014-05-11 NOTE — Plan of Care (Signed)
Problem: Phase I Progression Outcomes Goal: Initial discharge plan identified Outcome: Completed/Met Date Met:  05/11/14 Discussed with patient goals to meet before discharge

## 2014-05-11 NOTE — Progress Notes (Signed)
Cord blood gas results: PH  7.29 PCO2  50 HCO3  24

## 2014-05-11 NOTE — Progress Notes (Signed)
Pt assisted to bathroom, Voided and shown pericare.  Clean gown, peripad and icepack on.  Pt was then transferred via w/c in stable condition.

## 2014-05-11 NOTE — Anesthesia Postprocedure Evaluation (Signed)
  Anesthesia Post-op Note  Patient: Courtney Johnston  Procedure(s) Performed: * No procedures listed *  Anesthesia type:Regional, Epidural  Patient location: PACU  Post pain: Pain level controlled  Post assessment: Post-op Vital signs reviewed, Patient's Cardiovascular Status Stable, Respiratory Function Stable, Patent Airway and No signs of Nausea or vomiting  Post vital signs: Reviewed and stable  Last Vitals:  Filed Vitals:   05/11/14 1124  BP: 121/65  Pulse: 78  Temp: 36.9 C  Resp: 18    Level of consciousness: awake, alert  and patient cooperative  Complications: No apparent anesthesia complications

## 2014-05-11 NOTE — Progress Notes (Signed)
Post Partum Day 0 from a svd Subjective: no complaints, up ad lib, voiding and tolerating PO Denies HA, visual changes, RUQ pain Objective: Blood pressure 122/66, pulse 89, temperature 98.5 F (36.9 C), temperature source Oral, resp. rate 20, height 5\' 6"  (1.676 m), weight 99.338 kg (219 lb), last menstrual period 08/04/2013, SpO2 98 %, unknown if currently breastfeeding. No evidence of Pre-eclampsia   Physical Exam:  General: alert, cooperative and no distress Lochia: appropriate Uterine Fundus: firm Incision: NA DVT Evaluation: No evidence of DVT seen on physical exam.   Recent Labs  05/09/14 1445  HGB 12.2  HCT 35.8    Assessment/Plan: Plan for discharge on PPD 2.  Continue to monitor for s/x of pre-e   LOS: 2 days   Meloni Hinz,  Toni AmendCourtney 05/11/2014, 11:08 AM

## 2014-05-11 NOTE — Progress Notes (Signed)
Pt complete.  Foley d/c with tip intact.   Pt pushing

## 2014-05-12 LAB — FETAL SCREEN: Fetal Screen: NEGATIVE

## 2014-05-12 LAB — HEMATOCRIT: HEMATOCRIT: 27.6 % — AB (ref 35.0–47.0)

## 2014-05-12 MED ORDER — RHO D IMMUNE GLOBULIN 1500 UNIT/2ML IJ SOSY
300.0000 ug | PREFILLED_SYRINGE | Freq: Once | INTRAMUSCULAR | Status: AC
  Administered 2014-05-12: 300 ug via INTRAMUSCULAR
  Filled 2014-05-12: qty 2

## 2014-05-12 MED ORDER — FERROUS SULFATE 325 (65 FE) MG PO TABS: 325.0000 mg | ORAL_TABLET | Freq: Every day | ORAL | Status: DC

## 2014-05-12 MED ORDER — IBUPROFEN 600 MG PO TABS: 600.0000 mg | ORAL_TABLET | Freq: Four times a day (QID) | ORAL | Status: DC

## 2014-05-12 MED ORDER — DOCUSATE SODIUM 100 MG PO CAPS: 100.0000 mg | ORAL_CAPSULE | Freq: Every day | ORAL | Status: DC

## 2014-05-12 NOTE — Discharge Instructions (Signed)

## 2014-05-12 NOTE — Discharge Summary (Signed)
Obstetric Discharge Summary Reason for Admission: induction of labor and for elevated blood pressures  Prenatal Procedures: NST and ultrasound, TDAP Intrapartum Procedures: spontaneous vaginal delivery Postpartum Procedures: Rho(D) Ig Complications-Operative and Postpartum: 2 degree perineal laceration HEMOGLOBIN  Date Value Ref Range Status  05/09/2014 12.2 12.0 - 16.0 g/dL Final   HGB  Date Value Ref Range Status  08/26/2013 14.0 12.0-16.0 g/dL Final   HCT  Date Value Ref Range Status  05/12/2014 27.6* 35.0 - 47.0 % Final  08/26/2013 42.5 35.0-47.0 % Final    Physical Exam:  General: alert, cooperative, appears stated age and no distress Lochia: appropriate Uterine Fundus: firm Incision: NA DVT Evaluation: No evidence of DVT seen on physical exam.  Discharge Diagnoses: Term Pregnancy-delivered and GHTN  Discharge Information: Date: 05/12/2014 Activity: unrestricted Diet: routine Medications: PNV, Ibuprofen and Colace Condition: stable Instructions: refer to practice specific booklet Discharge to: home Follow-up Information    Follow up with [facility]. Schedule an appointment as soon as possible for a visit in 6 weeks.   Why:  for postpartum follow up   Contact information:   [phone]      Newborn Data: Live born female  Birth Weight: 8 lb 5.7 oz (3790 g) APGAR: 4, 7  Home with mother.  Courtney Johnston,  Courtney Johnston 05/12/2014, 10:33 AM

## 2014-05-13 LAB — RHOGAM INJECTION: Unit division: 0

## 2014-05-14 LAB — ABO/RH: ABO/RH(D): O NEG

## 2015-10-11 ENCOUNTER — Ambulatory Visit (INDEPENDENT_AMBULATORY_CARE_PROVIDER_SITE_OTHER): Payer: Medicaid Other | Admitting: Family

## 2015-10-11 ENCOUNTER — Encounter: Payer: Self-pay | Admitting: Family

## 2015-10-11 VITALS — BP 138/88 | HR 95 | Temp 98.0°F | Ht 66.0 in | Wt 209.8 lb

## 2015-10-11 DIAGNOSIS — N3001 Acute cystitis with hematuria: Secondary | ICD-10-CM | POA: Diagnosis not present

## 2015-10-11 DIAGNOSIS — Z23 Encounter for immunization: Secondary | ICD-10-CM | POA: Diagnosis not present

## 2015-10-11 DIAGNOSIS — Q600 Renal agenesis, unilateral: Secondary | ICD-10-CM

## 2015-10-11 DIAGNOSIS — R109 Unspecified abdominal pain: Secondary | ICD-10-CM | POA: Diagnosis not present

## 2015-10-11 LAB — COMPREHENSIVE METABOLIC PANEL
ALBUMIN: 4 g/dL (ref 3.5–5.2)
ALK PHOS: 58 U/L (ref 39–117)
ALT: 12 U/L (ref 0–35)
AST: 15 U/L (ref 0–37)
BILIRUBIN TOTAL: 0.3 mg/dL (ref 0.2–1.2)
BUN: 10 mg/dL (ref 6–23)
CALCIUM: 9 mg/dL (ref 8.4–10.5)
CO2: 27 mEq/L (ref 19–32)
Chloride: 103 mEq/L (ref 96–112)
Creatinine, Ser: 0.9 mg/dL (ref 0.40–1.20)
GFR: 79.65 mL/min (ref >60.00)
Glucose, Bld: 90 mg/dL (ref 70–99)
POTASSIUM: 4.1 meq/L (ref 3.5–5.1)
Sodium: 138 mEq/L (ref 135–145)
TOTAL PROTEIN: 7.8 g/dL (ref 6.0–8.3)

## 2015-10-11 LAB — URINALYSIS, ROUTINE W REFLEX MICROSCOPIC
Bilirubin Urine: NEGATIVE
KETONES UR: NEGATIVE
Nitrite: NEGATIVE
PH: 5.5 (ref 5.0–8.0)
Specific Gravity, Urine: 1.02 (ref 1.000–1.030)
URINE GLUCOSE: NEGATIVE
UROBILINOGEN UA: 0.2 (ref 0.0–1.0)

## 2015-10-11 LAB — POCT URINALYSIS DIPSTICK
BILIRUBIN UA: NEGATIVE
Glucose, UA: NEGATIVE
Ketones, UA: NEGATIVE
Nitrite, UA: NEGATIVE
PH UA: 5.5
Protein, UA: 30
Spec Grav, UA: 1.015
UROBILINOGEN UA: 0.2

## 2015-10-11 MED ORDER — CEPHALEXIN 500 MG PO CAPS: 500.0000 mg | ORAL_CAPSULE | Freq: Two times a day (BID) | ORAL | 0 refills | Status: AC

## 2015-10-11 NOTE — Patient Instructions (Signed)
Suspect muscle etiology however pending evaluation of your kidneys to ensure no stone, infection Please try moist heat and gentle stretching. Avoid any anti-inflammatory such as Aleve.  If there is no improvement in your symptoms, or if there is any worsening of symptoms, or if you have any additional concerns, please return for re-evaluation; or, if we are closed, consider going to the Emergency Room for evaluation if symptoms urgent.

## 2015-10-11 NOTE — Progress Notes (Signed)
Pre visit review using our clinic review tool, if applicable. No additional management support is needed unless otherwise documented below in the visit note. 

## 2015-10-11 NOTE — Assessment & Plan Note (Addendum)
Pain is resolved at this time. Reassured the patient has no CVA tenderness, fever, nausea, vomiting to suggest kidney stone. UA shows cloudy urine, small leukocytes, trace blood. I called patient to discuss these results and we jointly agreed that we will go ahead and start antibiotic while we wait for urine culture.Courtney Johnston. Pending CMP, urine culture, urinalysis.

## 2015-10-11 NOTE — Progress Notes (Signed)
Subjective:    Patient ID: Courtney Johnston, female    DOB: [birth year], 25 y.o.   MRN: [id number]  CC: Courtney Johnston is a 25 y.o. female who presents today to establish care.  HPI: Patient is here to establish care today. Saw NP here at our clinic 2 years ago.  She does complain of right low back pain, 4 days ago, since resolved. Accompanied by mother. She rolled over in bed and the pain suddenly started, lasted for a couple of hours and resolved with heating pad and taking one ibuprofen. Born with only a right kidney. No fever, nausea, vomiting, hematura. 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  Follows with nephrology.05/2015 Congenital solitary kidney h/o Holiday representativeconstruction. Normal creatinine. Discussed desire future pregnancy the solitary kidney. Nephrologist advised her she should meet with her OB for pre pregnancy counseling and have another BMP checked prior. She also advised that patient establish with MFM.   Follows with [facility] for women care. Due for pap.   Will return for CPE.      HISTORY:  Past Medical History:  Diagnosis Date  . Anemia   . History of chicken pox    25yrs old   Past Surgical History:  Procedure Laterality Date  . 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 Arthritis Mother   . Heart disease Mother   . Hypertension Mother   . Asthma Father   . Early death Father     Drowning  . Hypertension Maternal Aunt   . Heart disease Maternal Uncle   . Hypertension Maternal Uncle   . Heart disease Maternal Grandmother   . Hypertension Maternal Grandmother   . Arthritis Maternal Grandfather   . Hypertension Maternal Grandfather   . Diabetes Paternal Grandfather     Allergies: Review of patient's allergies indicates no known allergies. No current outpatient  prescriptions on file prior to visit.   No current facility-administered medications on file prior to visit.     Social History  Substance Use Topics  . Smoking status: Never Smoker  . Smokeless tobacco: Never Used  . Alcohol use No     Comment: occasionally.     Review of Systems  Constitutional: Negative for chills and fever.  Respiratory: Negative for cough.   Cardiovascular: Negative for chest pain and palpitations.  Gastrointestinal: Negative for diarrhea, nausea and vomiting.  Genitourinary: Positive for flank pain (resolved). Negative for decreased urine volume, dysuria, hematuria and urgency.      Objective:    BP 138/88   Pulse 95   Temp 98 F (36.7 C) (Oral)   Ht 5\' 6"  (1.676 m)   Wt 209 lb 12.8 oz (95.2 kg)   SpO2 97%   BMI 33.86 kg/m  BP Readings from Last 3 Encounters:  10/11/15 138/88  05/12/14 120/76  09/15/13 119/84   Wt Readings from Last 3 Encounters:  10/11/15 209 lb 12.8 oz (95.2 kg)  05/09/14 219 lb (99.3 kg)  09/15/13 185 lb (83.9 kg)    Physical Exam  Constitutional: She appears well-developed and well-nourished.  Eyes: Conjunctivae are normal.  Cardiovascular: Normal rate, regular rhythm, normal heart sounds and normal pulses.   Pulmonary/Chest: Effort normal and breath sounds normal. She has no wheezes. She has no rhonchi. She has  no rales.  Abdominal: There is no CVA tenderness.  Neurological: She is alert.  Skin: Skin is warm and dry.  Psychiatric: She has a normal mood and affect. Her speech is normal and behavior is normal. Thought content normal.  Vitals reviewed.      Assessment & Plan:   Problem List Items Addressed This Visit      Other   Congenital single kidney    Stable. Follows with [facility] nephrology.      Right flank pain    Pain is resolved at this time. Reassured the patient has no CVA tenderness, fever, nausea, vomiting to suggest kidney stone. UA shows cloudy urine, trace leukocytes, trace blood. I called patient  to discuss these results and we jointly agreed that we will go ahead and start antibiotic while we wait for urine culture.Marland Kitchen Pending CMP, urine culture, urinalysis.      Relevant Orders   Comprehensive metabolic panel   POCT urinalysis dipstick (Completed)   CULTURE, URINE COMPREHENSIVE   Urinalysis, Routine w reflex microscopic (not at [facility])    Other Visit Diagnoses    Acute cystitis with hematuria    -  Primary   Relevant Medications   cephALEXin (KEFLEX) 500 MG capsule       I have discontinued Ms. Hinze's multivitamin-prenatal, ferrous sulfate, ibuprofen, and docusate sodium. I am also having her start on cephALEXin. Additionally, I am having her maintain her norgestimate-ethinyl estradiol.   Meds ordered this encounter  Medications  . norgestimate-ethinyl estradiol (ORTHO-CYCLEN,SPRINTEC,PREVIFEM) 0.25-35 MG-MCG tablet    Sig: Take by mouth.  . cephALEXin (KEFLEX) 500 MG capsule    Sig: Take 1 capsule (500 mg total) by mouth 2 (two) times daily.    Dispense:  14 capsule    Refill:  0    Order Specific Question:   Supervising Provider    Answer:   Sherlene Shams [[id number]]    Return precautions given.   Risks, benefits, and alternatives of the medications and treatment plan prescribed today were discussed, and patient expressed understanding.   Education regarding symptom management and diagnosis given to patient on AVS.  Continue to follow with Rennie Plowman, FNP for routine health maintenance.   Courtney Johnston and I agreed with plan.   Rennie Plowman, FNP

## 2015-10-11 NOTE — Assessment & Plan Note (Signed)
Stable. [facility].

## 2015-10-15 ENCOUNTER — Other Ambulatory Visit: Payer: Self-pay | Admitting: Family

## 2015-10-15 DIAGNOSIS — R319 Hematuria, unspecified: Secondary | ICD-10-CM

## 2015-10-15 LAB — CULTURE, URINE COMPREHENSIVE

## 2015-10-17 ENCOUNTER — Other Ambulatory Visit (INDEPENDENT_AMBULATORY_CARE_PROVIDER_SITE_OTHER): Payer: Medicaid Other

## 2015-10-17 DIAGNOSIS — R319 Hematuria, unspecified: Secondary | ICD-10-CM | POA: Diagnosis not present

## 2015-10-18 LAB — URINALYSIS, ROUTINE W REFLEX MICROSCOPIC
Bilirubin Urine: NEGATIVE
KETONES UR: NEGATIVE
LEUKOCYTES UA: NEGATIVE
Nitrite: NEGATIVE
SPECIFIC GRAVITY, URINE: 1.015 (ref 1.000–1.030)
Total Protein, Urine: NEGATIVE
UROBILINOGEN UA: 0.2 (ref 0.0–1.0)
Urine Glucose: NEGATIVE
pH: 6 (ref 5.0–8.0)

## 2015-10-22 ENCOUNTER — Telehealth: Payer: Self-pay | Admitting: Family

## 2015-10-22 ENCOUNTER — Other Ambulatory Visit: Payer: Self-pay | Admitting: Family

## 2015-10-22 DIAGNOSIS — N3001 Acute cystitis with hematuria: Secondary | ICD-10-CM

## 2015-10-22 MED ORDER — CIPROFLOXACIN HCL 250 MG PO TABS: 250.0000 mg | ORAL_TABLET | Freq: Two times a day (BID) | ORAL | 0 refills | Status: DC

## 2015-10-22 NOTE — Telephone Encounter (Signed)
FYI  Spoke with patient  Treated patient for UTI with keflex 10/11/2015.   She is NOT having flank pain or dysuria. No fever and she is urinating.   We decided to treat the WBC and bacteria seen in UA 10/11.   Repeat UA next week.   She will also make f/u appt with nephroloy.

## 2015-10-31 ENCOUNTER — Other Ambulatory Visit (INDEPENDENT_AMBULATORY_CARE_PROVIDER_SITE_OTHER): Payer: Medicaid Other

## 2015-10-31 DIAGNOSIS — N3001 Acute cystitis with hematuria: Secondary | ICD-10-CM

## 2015-10-31 LAB — URINALYSIS, ROUTINE W REFLEX MICROSCOPIC
BILIRUBIN URINE: NEGATIVE
Ketones, ur: NEGATIVE
NITRITE: NEGATIVE
Specific Gravity, Urine: 1.01 (ref 1.000–1.030)
TOTAL PROTEIN, URINE-UPE24: NEGATIVE
Urine Glucose: NEGATIVE
Urobilinogen, UA: 0.2 (ref 0.0–1.0)
pH: 6 (ref 5.0–8.0)

## 2015-11-03 LAB — CULTURE, URINE COMPREHENSIVE

## 2016-01-16 ENCOUNTER — Ambulatory Visit: Payer: Medicaid Other | Admitting: Family

## 2016-05-08 ENCOUNTER — Encounter: Payer: Self-pay | Admitting: Family

## 2016-05-08 ENCOUNTER — Ambulatory Visit (INDEPENDENT_AMBULATORY_CARE_PROVIDER_SITE_OTHER): Payer: Medicaid Other | Admitting: Family

## 2016-05-08 VITALS — BP 134/84 | HR 86 | Temp 97.9°F | Ht 66.0 in | Wt 205.6 lb

## 2016-05-08 DIAGNOSIS — M7711 Lateral epicondylitis, right elbow: Secondary | ICD-10-CM | POA: Diagnosis not present

## 2016-05-08 MED ORDER — PREDNISONE 10 MG PO TABS
ORAL_TABLET | ORAL | 0 refills | Status: DC
Start: 2016-05-08 — End: 2016-06-24

## 2016-05-08 NOTE — Progress Notes (Signed)
Pre visit review using our clinic review tool, if applicable. No additional management support is needed unless otherwise documented below in the visit note. 

## 2016-05-08 NOTE — Patient Instructions (Signed)
Trial prednisone as discussed. Continue heat and ice. may also try topical agents as Aspercreme, BenGay. Continue to wear brace, and let me know if pain does not improve.   Tennis Elbow Rehab Ask your health care provider which exercises are safe for you. Do exercises exactly as told by your health care provider and adjust them as directed. It is normal to feel mild stretching, pulling, tightness, or discomfort as you do these exercises, but you should stop right away if you feel sudden pain or your pain gets worse. Do not begin these exercises until told by your health care provider. Stretching and range of motion exercises These exercises warm up your muscles and joints and improve the movement and flexibility of your elbow. These exercises also help to relieve pain, numbness, and tingling. Exercise A: Wrist extensor stretch  1. Extend your left / right elbow with your fingers pointing down. 2. Gently pull the palm of your left / right hand toward you until you feel a gentle stretch on the top of your forearm. 3. To increase the stretch, push your left / right hand toward the outer edge or pinkie side of your forearm. 4. Hold this position for __________ seconds. Repeat __________ times. Complete this exercise __________ times a day. If directed by your health care provider, repeat this stretch except do it with a bent elbow this time. Exercise B: Wrist flexor stretch   1. Extend your left / right elbow and turn your palm upward. 2. Gently pull your left / right palm and fingertips back so your wrist extends and your fingers point more toward the ground. 3. You should feel a gentle stretch on the inside of your forearm. 4. Hold this position for __________ seconds. Repeat __________ times. Complete this exercise __________ times a day. If directed by your health care provider, repeat this stretch except do it with a bent elbow this time. Strengthening exercises These exercises build strength  and endurance in your elbow. Endurance is the ability to use your muscles for a long time, even after they get tired. Exercise C: Wrist extensors   1. Sit with your left / right forearm palm-down and fully supported on a table or countertop. Your elbow should be resting below the height of your shoulder. 2. Let your left / right wrist extend over the edge of the surface. 3. Loosely hold a __________ weight or a piece of rubber exercise band or tubing in your left / right hand. Slowly curl your left / right hand up toward your forearm. If you are using band or tubing, hold the band or tubing in place with your other hand to provide resistance. 4. Hold this position for __________ seconds. 5. Slowly return to the starting position. Repeat __________ times. Complete this exercise __________ times a day. Exercise D: Radial deviators   1. Stand with a __________ weight in your left / righthand. Or, sit while holding a rubber exercise band or tubing with your other arm supported on a table or countertop. Position your hand so your thumb is on top. 2. Raise your hand upward in front of you so your thumb travels toward your forearm, or pull up on the rubber tubing. 3. Hold this position for __________ seconds. 4. Slowly return to the starting position. Repeat __________ times. Complete this exercise __________ times a day. Exercise E: Eccentric wrist extensors  1. Sit with your left / right forearm palm-down and fully supported on a table or countertop. Your elbow  should be resting below the height of your shoulder. 2. If told by your health care provider, hold a __________ weight in your hand. 3. Let your left / right wrist extend over the edge of the surface. 4. Use your other hand to lift up your left / right hand toward your forearm. Keep your forearm on the table. 5. Using only the muscles in your left / right hand, slowly lower your hand back down to the starting position. Repeat __________  times. Complete this exercise __________ times a day. This information is not intended to replace advice given to you by your health care provider. Make sure you discuss any questions you have with your health care provider. Document Released: 12/23/2004 Document Revised: 08/29/2015 Document Reviewed: 09/21/2014 Elsevier Interactive Patient Education  2017 [employer]

## 2016-05-08 NOTE — Progress Notes (Signed)
Subjective:    Patient ID: Courtney Johnston, female    DOB: Jun 23, 1990, 26 y.o.   MRN: [id number]  CC: Courtney Johnston is a 26 y.o. female who presents today for an acute visit.    HPI: CC: right elbow pain x couple of weeks, better last couple of days.Thinks occurred when helping lift her grandmother.  Heat/ice helped. When straightened out arm all the way or turns hand. No known injury, no click or pop. No F, N, V.        HISTORY:  Past Medical History:  Diagnosis Date  . Anemia   . History of chicken pox    26yrs old   Past Surgical History:  Procedure Laterality Date  . 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 Arthritis Mother   . Heart disease Mother   . Hypertension Mother   . Asthma Father   . Early death Father     Drowning  . Hypertension Maternal Aunt   . Heart disease Maternal Uncle   . Hypertension Maternal Uncle   . Heart disease Maternal Grandmother   . Hypertension Maternal Grandmother   . Arthritis Maternal Grandfather   . Hypertension Maternal Grandfather   . Diabetes Paternal Grandfather     Allergies: Patient has no known allergies. Current Outpatient Prescriptions on File Prior to Visit  Medication Sig Dispense Refill  . norgestimate-ethinyl estradiol (ORTHO-CYCLEN,SPRINTEC,PREVIFEM) 0.25-35 MG-MCG tablet Take by mouth.     No current facility-administered medications on file prior to visit.     Social History  Substance Use Topics  . Smoking status: Never Smoker  . Smokeless tobacco: Never Used  . Alcohol use No     Comment: occasionally.     Review of Systems  Constitutional: Negative for chills and fever.  Respiratory: Negative for cough.   Cardiovascular: Negative for chest pain and palpitations.  Gastrointestinal: Negative for nausea and vomiting.  Musculoskeletal: Negative for joint  swelling.      Objective:    BP 134/84   Pulse 86   Temp 97.9 F (36.6 C) (Oral)   Ht 5\' 6"  (1.676 m)   Wt 205 lb 9.6 oz (93.3 kg)   SpO2 97%   BMI 33.18 kg/m    Physical Exam  Constitutional: She appears well-developed and well-nourished.  Eyes: Conjunctivae are normal.  Cardiovascular: Normal rate, regular rhythm, normal heart sounds and normal pulses.   Palpable radial pulses.   Pulmonary/Chest: Effort normal and breath sounds normal. She has no wheezes. She has no rhonchi. She has no rales.  Musculoskeletal:  Right elbow:   Full ROM with flexion, extension, supination, and pronation.  Strength 5/5. +Yergason's test.  No ecchymosis or swelling.   Neurological: She is alert.  Grip strength normal.  Sensation intact BUE.   Skin: Skin is warm and dry.  Psychiatric: She has a normal mood and affect. Her speech is normal and behavior is normal. Thought content normal.  Vitals reviewed.      Assessment & Plan:  1. Lateral epicondylitis of right elbow Symptoms most consistent with lateral epicondylitis. Patient and I discussed conservative therapy and she'll continue wearing brace, alternating heat and ice. History of one congenital kidney, not a candidate for oral NSAIDs. We decided appropriate to trial prednisone to see if offers relief. Advised to avoid further injury with lifting  -  predniSONE (DELTASONE) 10 MG tablet; Take 4 tablets ( total 40 mg) by mouth for 2 days; take 3 tablets ( total 30 mg) by mouth for 2 days; take 2 tablets ( total 20 mg) by mouth for 1 day; take 1 tablet ( total 10 mg) by mouth for 1 day.  Dispense: 17 tablet; Refill: 0     I have discontinued Ms. Toor's ciprofloxacin. I am also having her maintain her norgestimate-ethinyl estradiol.   No orders of the defined types were placed in this encounter.   Return precautions given.   Risks, benefits, and alternatives of the medications and treatment plan prescribed today were discussed,  and patient expressed understanding.   Education regarding symptom management and diagnosis given to patient on AVS.  Continue to follow with Rennie PlowmanMargaret Court Gracia, FNP for routine health maintenance.   Mickala Ardelle BallsMichelle Gebel and I agreed with plan.   Rennie PlowmanMargaret Kirstina Leinweber, FNP

## 2016-06-24 ENCOUNTER — Telehealth: Payer: Self-pay | Admitting: Family

## 2016-06-24 DIAGNOSIS — M25529 Pain in unspecified elbow: Secondary | ICD-10-CM

## 2016-06-24 MED ORDER — PREDNISONE 10 MG PO TABS
ORAL_TABLET | ORAL | 0 refills | Status: DC
Start: 2016-06-24 — End: 2016-07-30

## 2016-06-24 NOTE — Telephone Encounter (Signed)
Call pt  Sent in prednisone  If persists- we willl send to ortho

## 2016-06-24 NOTE — Telephone Encounter (Signed)
Pt saw Arnett on 05/08/16 for tennis elbow. Pt's elbow did get better but now it is hurting again. Pt is requesting another refill on the medication that was given to her on that visit or does she need to come back in to see her. Phone number is [phone].

## 2016-06-24 NOTE — Telephone Encounter (Signed)
Patient wants prednisone. Please advise.

## 2016-06-24 NOTE — Telephone Encounter (Signed)
Patient has been informed.

## 2016-07-22 ENCOUNTER — Telehealth: Payer: Self-pay

## 2016-07-22 MED ORDER — NORGESTIMATE-ETH ESTRADIOL 0.25-35 MG-MCG PO TABS: 1.0000 | ORAL_TABLET | Freq: Every day | ORAL | 0 refills | Status: DC

## 2016-07-22 NOTE — Telephone Encounter (Signed)
Pt calling for a refill of her bcp,  She will run out this Sunday and her appt is Wed of next week.  Pt aware refill eRx'd.

## 2016-07-30 ENCOUNTER — Encounter: Payer: Self-pay | Admitting: Advanced Practice Midwife

## 2016-07-30 ENCOUNTER — Ambulatory Visit (INDEPENDENT_AMBULATORY_CARE_PROVIDER_SITE_OTHER): Payer: Medicaid Other | Admitting: Advanced Practice Midwife

## 2016-07-30 VITALS — BP 128/82 | Ht 66.0 in | Wt 204.0 lb

## 2016-07-30 DIAGNOSIS — Z3041 Encounter for surveillance of contraceptive pills: Secondary | ICD-10-CM | POA: Diagnosis not present

## 2016-07-30 DIAGNOSIS — Z124 Encounter for screening for malignant neoplasm of cervix: Secondary | ICD-10-CM

## 2016-07-30 DIAGNOSIS — Z01419 Encounter for gynecological examination (general) (routine) without abnormal findings: Secondary | ICD-10-CM

## 2016-07-30 DIAGNOSIS — Z0000 Encounter for general adult medical examination without abnormal findings: Secondary | ICD-10-CM

## 2016-07-30 LAB — HM PAP SMEAR: HM Pap smear: NORMAL

## 2016-07-30 MED ORDER — NORGESTIMATE-ETH ESTRADIOL 0.25-35 MG-MCG PO TABS: 1.0000 | ORAL_TABLET | Freq: Every day | ORAL | 11 refills | Status: DC

## 2016-07-30 NOTE — Progress Notes (Signed)
Patient ID: Courtney Johnston, female   DOB: 07/20/1990, 26 y.o.   MRN: [id number]     Gynecology Annual Exam  PCP: Allegra GranaArnett, Margaret G, FNP  Chief Complaint:  Chief Complaint  Patient presents with  . Gynecologic Exam    History of Present Illness: Patient is a 26 y.o. G1P1001 presents for annual exam. The patient has no complaints today. She is requesting a refill of her birth control.   LMP: Patient's last menstrual period was 07/25/2016. Average Interval: regular, 28 days Duration of flow: 5 days Heavy Menses: no Clots: no Intermenstrual Bleeding: no Postcoital Bleeding: no Dysmenorrhea: no  The patient is sexually active. She currently uses OCP (estrogen/progesterone) for contraception. She denies dyspareunia.  The patient does perform self breast exams.  There is notable family history of breast or ovarian cancer in her family.  The patient wears seatbelts: yes.   The patient has regular exercise: yes. 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 She denies cardio exercise.   The patient denies current symptoms of depression.    Review of Systems: Review of Systems  Constitutional: Negative.   HENT: Negative.   Eyes: Negative.   Respiratory: Negative.   Cardiovascular: Negative.   Gastrointestinal: Negative.   Genitourinary: Negative.   Musculoskeletal: Negative.   Skin: Negative.   Neurological: Negative.   Endo/Heme/Allergies: Negative.   Psychiatric/Behavioral: Negative.     Past Medical History:  Past Medical History:  Diagnosis Date  . Anemia   . History of chicken pox    [age]yrs old    Past Surgical History:  Past Surgical History:  Procedure Laterality Date  . KIDNEY SURGERY Right 1990   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 Right kidney surgery for 95% blockage    Gynecologic History:  Patient's last menstrual period was 07/25/2016. Contraception: OCP (estrogen/progesterone) Last Pap:  Results were: no abnormalities   Obstetric History: G1P1001  Family History:  Family History  Problem Relation Age of Onset  . Arthritis Mother   . Heart disease Mother   . Hypertension Mother   . Asthma Father   . Early death Father        Drowning  . Hypertension Maternal Aunt   . Heart disease Maternal Uncle   . Hypertension Maternal Uncle   . Heart disease Maternal Grandmother   . Hypertension Maternal Grandmother   . Arthritis Maternal Grandfather   . Hypertension Maternal Grandfather   . Diabetes Paternal Grandfather     Social History:  Social History   Social History  . Marital status: Single    Spouse name: N/A  . Number of children: 0  . Years of education: 12   Occupational History  . Caregiver    Social History Main Topics  . Smoking status: Never Smoker  . Smokeless tobacco: Never Used  . Alcohol use No     Comment: occasionally.   . Drug use: No  . Sexual activity: Yes    Birth control/ protection: None   Other Topics Concern  . Not on file   Social History Narrative   Hobbies: Reading. She enjoys spending time with family   Caffeine: 1 cup of coffee daily   Exercise: once a week. Walking on treadmill    Allergies:  No Known Allergies  Medications: Prior to Admission medications   Medication Sig Start Date End Date Taking? Authorizing Provider  fluticasone (FLONASE) 50 MCG/ACT nasal spray Place into the nose. 05/19/16  Yes [provider]  norgestimate-ethinyl estradiol (ORTHO-CYCLEN,SPRINTEC,PREVIFEM) 0.25-35 MG-MCG tablet Take 1 tablet by mouth daily. 07/22/16   Copland, Ilona SorrelAlicia B, PA-C    Physical Exam Vitals: Blood pressure 128/82, height 5\' 6"  (1.676 m), weight 204 lb (92.5 kg), last menstrual period 07/25/2016, not currently breastfeeding.  General: NAD HEENT: normocephalic, anicteric Thyroid: no enlargement, no palpable nodules Pulmonary: No increased work of breathing, CTAB Cardiovascular: RRR, distal pulses 2+ Breast:  Breast symmetrical, no tenderness, no palpable nodules or masses, no skin or nipple retraction present, no nipple discharge.  No axillary or supraclavicular lymphadenopathy. Abdomen: NABS, soft, non-tender, non-distended.  Umbilicus without lesions.  No hepatomegaly, splenomegaly or masses palpable. No evidence of hernia  Genitourinary:  External: Normal external female genitalia.  Normal urethral meatus, normal  Bartholin's and Skene's glands.    Vagina: Normal vaginal mucosa, no evidence of prolapse.    Cervix: 1 cm Nabothian cyst on patient's right, no bleeding, no CMT- also examined by Dr Tiburcio PeaHarris and not found to be concerning.  Uterus: Non-enlarged, mobile, normal contour.    Adnexa: ovaries non-enlarged, no adnexal masses  Rectal: deferred  Lymphatic: no evidence of inguinal lymphadenopathy Extremities: no edema, erythema, or tenderness Neurologic: Grossly intact Psychiatric: mood appropriate, affect full    Assessment: 26 y.o. G1P1001 Well woman exam with PAP smear  Plan: Problem List Items Addressed This Visit    None    Visit Diagnoses    Well woman exam with routine gynecological exam    -  Primary      1) STI screening was offered and declined  2) ASCCP guidelines and rational discussed.  Patient opts for every 3 years screening interval  3) Contraception - Patient plans to continue with combined oral contraceptive  4) Routine healthcare maintenance including cholesterol, diabetes screening discussed Declines  5) Follow up 1 year for routine annual exam  Tresea MallJane Jazzmin Johnston, CNM

## 2016-07-31 LAB — IGP, RFX APTIMA HPV ASCU: PAP Smear Comment: 0

## 2017-06-28 ENCOUNTER — Other Ambulatory Visit: Payer: Self-pay | Admitting: Advanced Practice Midwife

## 2017-06-28 DIAGNOSIS — Z3041 Encounter for surveillance of contraceptive pills: Secondary | ICD-10-CM

## 2017-06-29 ENCOUNTER — Other Ambulatory Visit: Payer: Self-pay

## 2017-06-29 DIAGNOSIS — Z3041 Encounter for surveillance of contraceptive pills: Secondary | ICD-10-CM

## 2017-06-29 MED ORDER — NORGESTIMATE-ETH ESTRADIOL 0.25-35 MG-MCG PO TABS: 1.0000 | ORAL_TABLET | Freq: Every day | ORAL | 0 refills | Status: DC

## 2017-06-29 NOTE — Telephone Encounter (Signed)
Pt sched annual for 7/31 and will run out of bcp before then.  Calling for refill.  [phone].  Pt aware refill eRx'd and wanted it to go to [facility].

## 2017-07-08 ENCOUNTER — Ambulatory Visit (INDEPENDENT_AMBULATORY_CARE_PROVIDER_SITE_OTHER): Payer: Medicaid Other | Admitting: Internal Medicine

## 2017-07-08 ENCOUNTER — Encounter: Payer: Self-pay | Admitting: Internal Medicine

## 2017-07-08 VITALS — BP 120/86 | HR 89 | Temp 98.1°F | Resp 18 | Ht 66.0 in | Wt 212.5 lb

## 2017-07-08 DIAGNOSIS — R300 Dysuria: Secondary | ICD-10-CM | POA: Diagnosis not present

## 2017-07-08 DIAGNOSIS — N181 Chronic kidney disease, stage 1: Secondary | ICD-10-CM

## 2017-07-08 DIAGNOSIS — Q600 Renal agenesis, unilateral: Secondary | ICD-10-CM | POA: Diagnosis not present

## 2017-07-08 LAB — COMPREHENSIVE METABOLIC PANEL
ALK PHOS: 55 U/L (ref 39–117)
ALT: 12 U/L (ref 0–35)
AST: 13 U/L (ref 0–37)
Albumin: 4 g/dL (ref 3.5–5.2)
BUN: 12 mg/dL (ref 6–23)
CHLORIDE: 101 meq/L (ref 96–112)
CO2: 26 mEq/L (ref 19–32)
Calcium: 9.1 mg/dL (ref 8.4–10.5)
Creatinine, Ser: 0.92 mg/dL (ref 0.40–1.20)
GFR: 76.69 mL/min (ref >60.00)
Glucose, Bld: 95 mg/dL (ref 70–99)
POTASSIUM: 4 meq/L (ref 3.5–5.1)
SODIUM: 137 meq/L (ref 135–145)
TOTAL PROTEIN: 7.2 g/dL (ref 6.0–8.3)
Total Bilirubin: 0.3 mg/dL (ref 0.2–1.2)

## 2017-07-08 LAB — POCT URINALYSIS DIPSTICK
BILIRUBIN UA: NEGATIVE
Blood, UA: POSITIVE
Glucose, UA: NEGATIVE
Ketones, UA: NEGATIVE
Nitrite, UA: NEGATIVE
Protein, UA: NEGATIVE
Spec Grav, UA: 1.025 (ref 1.010–1.025)
Urobilinogen, UA: 0.2 E.U./dL
pH, UA: 6 (ref 5.0–8.0)

## 2017-07-08 LAB — URINALYSIS, MICROSCOPIC ONLY: RBC / HPF: NONE SEEN (ref 0–?)

## 2017-07-08 MED ORDER — CIPROFLOXACIN HCL 250 MG PO TABS: 250.0000 mg | ORAL_TABLET | Freq: Two times a day (BID) | ORAL | 0 refills | Status: DC

## 2017-07-08 MED ORDER — PHENAZOPYRIDINE HCL 200 MG PO TABS: 200.0000 mg | ORAL_TABLET | Freq: Three times a day (TID) | ORAL | 0 refills | Status: DC | PRN

## 2017-07-08 NOTE — Patient Instructions (Signed)
I have prescribed ciprofloxacin to take twice daily for your UTI, and pyridium for the pain   Taking an antibiotic can create an imbalance in the normal population of bacteria that live in the small intestine.  This imbalance can persist for 3 months.   Taking a probiotic ( Align, Floraque or Culturelle), the generic version of one of these over the counter medications, or an alternative form (kombucha,  Yogurt, or another dietary source) for a minimum of 3 weeks may help prevent a serious antibiotic associated diarrhea  Called clostridium dificile colitis that occurs when the bacteria population is altered .  Taking a probiotic may also prevent vaginitis due to yeast infections and can be continued indefinitely if you feel that it improves your digestion or your elimination (bowels).

## 2017-07-08 NOTE — Progress Notes (Signed)
Subjective:  Patient ID: Courtney Johnston, female    DOB: 1990/07/27  Age: 27 y.o. MRN: [id number]  CC: The primary encounter diagnosis was Dysuria. 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  HPI [name] presents for signs and symptoms of UTI  That have been present for the last 3 days  Started with strong smell  Followed by dysuria and followed by vaginal pain.   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 Last follow up with [facility]  nephrologist was Dec 2017 prior to her last pregnancy. . History of recurrent UTIs in 2017.  1 year follow up was advised .  Last well woman exam July 2018 by [facility]. NOrmal PAP     Outpatient Medications Prior to Visit  Medication Sig Dispense Refill  . norgestimate-ethinyl estradiol (SPRINTEC 28) 0.25-35 MG-MCG tablet Take 1 tablet by mouth daily. 84 tablet 0   No facility-administered medications prior to visit.     Review of Systems;  Patient denies headache, fevers, malaise, unintentional weight loss, skin rash, eye pain, sinus congestion and sinus pain, sore throat, dysphagia,  hemoptysis , cough, dyspnea, wheezing, chest pain, palpitations, orthopnea, edema, abdominal pain, nausea, melena, diarrhea, constipation, flank pain, dysuria, hematuria, urinary  Frequency, nocturia, numbness, tingling, seizures,  Focal weakness, Loss of consciousness,  Tremor, insomnia, depression, anxiety, and suicidal ideation.      Objective:  BP 120/86 (BP Location: Left Arm, Patient Position: Sitting, Cuff Size: Normal)   Pulse 89   Temp 98.1 F (36.7 C) (Oral)   Resp 18   Ht 5\' 6"  (1.676 m)   Wt 212 lb 8 oz (96.4 kg)   SpO2 98%   BMI 34.30 kg/m   BP Readings from Last 3 Encounters:  07/08/17 120/86  07/30/16  128/82  05/08/16 134/84    Wt Readings from Last 3 Encounters:  07/08/17 212 lb 8 oz (96.4 kg)  07/30/16 204 lb (92.5 kg)  05/08/16 205 lb 9.6 oz (93.3 kg)    General appearance: alert, cooperative and appears stated age Ears: normal TM's and external ear canals both ears Throat: lips, mucosa, and tongue normal; teeth and gums normal Neck: no adenopathy, no carotid bruit, supple, symmetrical, trachea midline and thyroid not enlarged, symmetric, no tenderness/mass/nodules Back: symmetric, no curvature. ROM normal. No CVA tenderness. Lungs: clear to auscultation bilaterally Heart: regular rate and rhythm, S1, S2 normal, no murmur, click, rub or gallop Abdomen: soft, non-tender; bowel sounds normal; no masses,  no organomegaly Pulses: 2+ and symmetric Skin: Skin color, texture, turgor normal. No rashes or lesions Lymph nodes: Cervical, supraclavicular, and axillary nodes normal.  No results found for: HGBA1C  Lab Results  Component Value Date   CREATININE 0.92 07/08/2017   CREATININE 0.90 10/11/2015   CREATININE 0.57 05/09/2014    Lab Results  Component Value Date   WBC 10.1 05/09/2014   HGB 12.2 05/09/2014   HCT 27.6 (L) 05/12/2014   PLT 243 05/09/2014   GLUCOSE 95 07/08/2017   CHOL 221 (H) 09/15/2013   TRIG 66.0 09/15/2013   HDL 50.10 09/15/2013   LDLCALC 158 (H) 09/15/2013   ALT 12 07/08/2017   AST 13 07/08/2017   NA 137 07/08/2017   K 4.0 07/08/2017   CL 101 07/08/2017  CREATININE 0.92 07/08/2017   BUN 12 07/08/2017   CO2 26 07/08/2017   TSH 1.89 09/15/2013    No results found.  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  [facility]   Lab Results  Component Value Date   CREATININE 0.92  07/08/2017   Lab Results  Component Value Date   NA 137 07/08/2017   K 4.0 07/08/2017   CL 101 07/08/2017   CO2 26 07/08/2017         Dysuria - Primary    POCT UA suggestive of uncomplicated cystitis.  Empiric ciprofloxacin prescribed. Probiotic advised.  Formal UA and culture were nondiagnostic.  aptient will be advised to dc abx and if symptoms recur,  Will need to have pelvic exam, which she is due for .       Relevant Orders   POCT Urinalysis Dipstick (Completed)   Urine Culture (Completed)   Urine Microscopic Only (Completed)      I am having Judy M. Louk start on ciprofloxacin and phenazopyridine. I am also having her maintain her norgestimate-ethinyl estradiol.  Meds ordered this encounter  Medications  . ciprofloxacin (CIPRO) 250 MG tablet    Sig: Take 1 tablet (250 mg total) by mouth 2 (two) times daily.    Dispense:  10 tablet    Refill:  0  . phenazopyridine (PYRIDIUM) 200 MG tablet    Sig: Take 1 tablet (200 mg total) by mouth 3 (three) times daily as needed for pain.    Dispense:  10 tablet    Refill:  0    There are no discontinued medications.  Follow-up: No follow-ups on file.   Sherlene Shamseresa L Tullo, MD

## 2017-07-09 LAB — URINE CULTURE
MICRO NUMBER:: 90793110
SPECIMEN QUALITY:: ADEQUATE

## 2017-07-11 DIAGNOSIS — R300 Dysuria: Secondary | ICD-10-CM | POA: Insufficient documentation

## 2017-07-11 NOTE — Assessment & Plan Note (Addendum)
POCT UA suggestive of uncomplicated cystitis.  Empiric ciprofloxacin prescribed. Probiotic advised.  Formal UA and culture were nondiagnostic.  aptient will be advised to dc abx and if symptoms recur,  Will need to have pelvic exam, which she is due for .

## 2017-07-11 NOTE — Assessment & Plan Note (Signed)
Renal function is unchanged and she is avoiding use of NSAIDs.  [facility]   Lab Results  Component Value Date   CREATININE 0.92 07/08/2017   Lab Results  Component Value Date   NA 137 07/08/2017   K 4.0 07/08/2017   CL 101 07/08/2017   CO2 26 07/08/2017

## 2017-08-05 ENCOUNTER — Ambulatory Visit (INDEPENDENT_AMBULATORY_CARE_PROVIDER_SITE_OTHER): Payer: Medicaid Other | Admitting: Advanced Practice Midwife

## 2017-08-05 ENCOUNTER — Encounter: Payer: Self-pay | Admitting: Advanced Practice Midwife

## 2017-08-05 VITALS — BP 122/78 | HR 91 | Ht 66.0 in | Wt 213.0 lb

## 2017-08-05 DIAGNOSIS — Z0000 Encounter for general adult medical examination without abnormal findings: Secondary | ICD-10-CM | POA: Diagnosis not present

## 2017-08-05 DIAGNOSIS — Z3041 Encounter for surveillance of contraceptive pills: Secondary | ICD-10-CM

## 2017-08-05 MED ORDER — NORGESTIMATE-ETH ESTRADIOL 0.25-35 MG-MCG PO TABS: 1.0000 | ORAL_TABLET | Freq: Every day | ORAL | 4 refills | Status: DC

## 2017-08-05 NOTE — Patient Instructions (Signed)
Health Maintenance, Female Adopting a healthy lifestyle and getting preventive care can go a long way to promote health and wellness. Talk with your health care provider about what schedule of regular examinations is right for you. This is a good chance for you to check in with your provider about disease prevention and staying healthy. In between checkups, there are plenty of things you can do on your own. Experts have done a lot of research about which lifestyle changes and preventive measures are most likely to keep you healthy. Ask your health care provider for more information. Weight and diet Eat a healthy diet  Be sure to include plenty of vegetables, fruits, low-fat dairy products, and lean protein.  Do not eat a lot of foods high in solid fats, added sugars, or salt.  Get regular exercise. This is one of the most important things you can do for your health. ? Most adults should exercise for at least 150 minutes each week. The exercise should increase your heart rate and make you sweat (moderate-intensity exercise). ? Most adults should also do strengthening exercises at least twice a week. This is in addition to the moderate-intensity exercise.  Maintain a healthy weight  Body mass index (BMI) is a measurement that can be used to identify possible weight problems. It estimates body fat based on height and weight. Your health care provider can help determine your BMI and help you achieve or maintain a healthy weight.  For females 69 years of age and older: ? A BMI below 18.5 is considered underweight. ? A BMI of 18.5 to 24.9 is normal. ? A BMI of 25 to 29.9 is considered overweight. ? A BMI of 30 and above is considered obese.  Watch levels of cholesterol and blood lipids  You should start having your blood tested for lipids and cholesterol at 29 years of age, then have this test every 5 years.  You may need to have your cholesterol levels checked more often if: ? Your lipid or  cholesterol levels are high. ? You are older than 29 years of age. ? You are at high risk for heart disease.  Cancer screening Lung Cancer  Lung cancer screening is recommended for adults 70-27 years old who are at high risk for lung cancer because of a history of smoking.  A yearly low-dose CT scan of the lungs is recommended for people who: ? Currently smoke. ? Have quit within the past 15 years. ? Have at least a 30-pack-year history of smoking. A pack year is smoking an average of one pack of cigarettes a day for 1 year.  Yearly screening should continue until it has been 15 years since you quit.  Yearly screening should stop if you develop a health problem that would prevent you from having lung cancer treatment.  Breast Cancer  Practice breast self-awareness. This means understanding how your breasts normally appear and feel.  It also means doing regular breast self-exams. Let your health care provider know about any changes, no matter how small.  If you are in your 20s or 30s, you should have a clinical breast exam (CBE) by a health care provider every 1-3 years as part of a regular health exam.  If you are 68 or older, have a CBE every year. Also consider having a breast X-ray (mammogram) every year.  If you have a family history of breast cancer, talk to your health care provider about genetic screening.  If you are at high risk  for breast cancer, talk to your health care provider about having an MRI and a mammogram every year.  Breast cancer gene (BRCA) assessment is recommended for women who have family members with BRCA-related cancers. BRCA-related cancers include: ? Breast. ? Ovarian. ? Tubal. ? Peritoneal cancers.  Results of the assessment will determine the need for genetic counseling and BRCA1 and BRCA2 testing.  Cervical Cancer Your health care provider may recommend that you be screened regularly for cancer of the pelvic organs (ovaries, uterus, and  vagina). This screening involves a pelvic examination, including checking for microscopic changes to the surface of your cervix (Pap test). You may be encouraged to have this screening done every 3 years, beginning at age 22.  For women ages 56-65, health care providers may recommend pelvic exams and Pap testing every 3 years, or they may recommend the Pap and pelvic exam, combined with testing for human papilloma virus (HPV), every 5 years. Some types of HPV increase your risk of cervical cancer. Testing for HPV may also be done on women of any age with unclear Pap test results.  Other health care providers may not recommend any screening for nonpregnant women who are considered low risk for pelvic cancer and who do not have symptoms. Ask your health care provider if a screening pelvic exam is right for you.  If you have had past treatment for cervical cancer or a condition that could lead to cancer, you need Pap tests and screening for cancer for at least 20 years after your treatment. If Pap tests have been discontinued, your risk factors (such as having a new sexual partner) need to be reassessed to determine if screening should resume. Some women have medical problems that increase the chance of getting cervical cancer. In these cases, your health care provider may recommend more frequent screening and Pap tests.  Colorectal Cancer  This type of cancer can be detected and often prevented.  Routine colorectal cancer screening usually begins at 29 years of age and continues through 29 years of age.  Your health care provider may recommend screening at an earlier age if you have risk factors for colon cancer.  Your health care provider may also recommend using home test kits to check for hidden blood in the stool.  A small camera at the end of a tube can be used to examine your colon directly (sigmoidoscopy or colonoscopy). This is done to check for the earliest forms of colorectal  cancer.  Routine screening usually begins at age 33.  Direct examination of the colon should be repeated every 5-10 years through 29 years of age. However, you may need to be screened more often if early forms of precancerous polyps or small growths are found.  Skin Cancer  Check your skin from head to toe regularly.  Tell your health care provider about any new moles or changes in moles, especially if there is a change in a mole's shape or color.  Also tell your health care provider if you have a mole that is larger than the size of a pencil eraser.  Always use sunscreen. Apply sunscreen liberally and repeatedly throughout the day.  Protect yourself by wearing long sleeves, pants, a wide-brimmed hat, and sunglasses whenever you are outside.  Heart disease, diabetes, and high blood pressure  High blood pressure causes heart disease and increases the risk of stroke. High blood pressure is more likely to develop in: ? People who have blood pressure in the high end of  the normal range (130-139/85-89 mm Hg). ? People who are overweight or obese. ? People who are African American.  If you are 21-29 years of age, have your blood pressure checked every 3-5 years. If you are 3 years of age or older, have your blood pressure checked every year. You should have your blood pressure measured twice-once when you are at a hospital or clinic, and once when you are not at a hospital or clinic. Record the average of the two measurements. To check your blood pressure when you are not at a hospital or clinic, you can use: ? An automated blood pressure machine at a pharmacy. ? A home blood pressure monitor.  If you are between 17 years and 37 years old, ask your health care provider if you should take aspirin to prevent strokes.  Have regular diabetes screenings. This involves taking a blood sample to check your fasting blood sugar level. ? If you are at a normal weight and have a low risk for diabetes,  have this test once every three years after 29 years of age. ? If you are overweight and have a high risk for diabetes, consider being tested at a younger age or more often. Preventing infection Hepatitis B  If you have a higher risk for hepatitis B, you should be screened for this virus. You are considered at high risk for hepatitis B if: ? You were born in a country where hepatitis B is common. Ask your health care provider which countries are considered high risk. ? Your parents were born in a high-risk country, and you have not been immunized against hepatitis B (hepatitis B vaccine). ? You have HIV or AIDS. ? You use needles to inject street drugs. ? You live with someone who has hepatitis B. ? You have had sex with someone who has hepatitis B. ? You get hemodialysis treatment. ? You take certain medicines for conditions, including cancer, organ transplantation, and autoimmune conditions.  Hepatitis C  Blood testing is recommended for: ? Everyone born from [birth year] through 1967. ? Anyone with known risk factors for hepatitis C.  Sexually transmitted infections (STIs)  You should be screened for sexually transmitted infections (STIs) including gonorrhea and chlamydia if: ? You are sexually active and are younger than 29 years of age. ? You are older than 29 years of age and your health care provider tells you that you are at risk for this type of infection. ? Your sexual activity has changed since you were last screened and you are at an increased risk for chlamydia or gonorrhea. Ask your health care provider if you are at risk.  If you do not have HIV, but are at risk, it may be recommended that you take a prescription medicine daily to prevent HIV infection. This is called pre-exposure prophylaxis (PrEP). You are considered at risk if: ? You are sexually active and do not regularly use condoms or know the HIV status of your partner(s). ? You take drugs by injection. ? You are  sexually active with a partner who has HIV.  Talk with your health care provider about whether you are at high risk of being infected with HIV. If you choose to begin PrEP, you should first be tested for HIV. You should then be tested every 3 months for as long as you are taking PrEP. Pregnancy  If you are premenopausal and you may become pregnant, ask your health care provider about preconception counseling.  If you may become  pregnant, take 400 to 800 micrograms (mcg) of folic acid every day.  If you want to prevent pregnancy, talk to your health care provider about birth control (contraception). Osteoporosis and menopause  Osteoporosis is a disease in which the bones lose minerals and strength with aging. This can result in serious bone fractures. Your risk for osteoporosis can be identified using a bone density scan.  If you are 52 years of age or older, or if you are at risk for osteoporosis and fractures, ask your health care provider if you should be screened.  Ask your health care provider whether you should take a calcium or vitamin D supplement to lower your risk for osteoporosis.  Menopause may have certain physical symptoms and risks.  Hormone replacement therapy may reduce some of these symptoms and risks. Talk to your health care provider about whether hormone replacement therapy is right for you. Follow these instructions at home:  Schedule regular health, dental, and eye exams.  Stay current with your immunizations.  Do not use any tobacco products including cigarettes, chewing tobacco, or electronic cigarettes.  If you are pregnant, do not drink alcohol.  If you are breastfeeding, limit how much and how often you drink alcohol.  Limit alcohol intake to no more than 1 drink per day for nonpregnant women. One drink equals 12 ounces of beer, 5 ounces of wine, or 1 ounces of hard liquor.  Do not use street drugs.  Do not share needles.  Ask your health care  provider for help if you need support or information about quitting drugs.  Tell your health care provider if you often feel depressed.  Tell your health care provider if you have ever been abused or do not feel safe at home. This information is not intended to replace advice given to you by your health care provider. Make sure you discuss any questions you have with your health care provider. Document Released: 07/08/2010 Document Revised: 05/31/2015 Document Reviewed: 09/26/2014 Elsevier Interactive Patient Education  2018 [employer].     Why follow it? Research shows. . Those who follow the Mediterranean diet have a reduced risk of heart disease  . The diet is associated with a reduced incidence of Parkinson's and Alzheimer's diseases . People following the diet may have longer life expectancies and lower rates of chronic diseases  . The Dietary Guidelines for Americans recommends the Mediterranean diet as an eating plan to promote health and prevent disease  What Is the Mediterranean Diet?  . Healthy eating plan based on typical foods and recipes of Mediterranean-style cooking . The diet is primarily a plant based diet; these foods should make up a majority of meals   Starches - Plant based foods should make up a majority of meals - They are an important sources of vitamins, minerals, energy, antioxidants, and fiber - Choose whole grains, foods high in fiber and minimally processed items  - Typical grain sources include wheat, oats, barley, corn, brown rice, bulgar, farro, millet, polenta, couscous  - Various types of beans include chickpeas, lentils, fava beans, black beans, white beans   Fruits  Veggies - Large quantities of antioxidant rich fruits & veggies; 6 or more servings  - Vegetables can be eaten raw or lightly drizzled with oil and cooked  - Vegetables common to the traditional Mediterranean Diet include: artichokes, arugula, beets, broccoli, brussel sprouts, cabbage,  carrots, celery, collard greens, cucumbers, eggplant, kale, leeks, lemons, lettuce, mushrooms, okra, onions, peas, peppers, potatoes, pumpkin, radishes, rutabaga,  shallots, spinach, sweet potatoes, turnips, zucchini - Fruits common to the Mediterranean Diet include: apples, apricots, avocados, cherries, clementines, dates, figs, grapefruits, grapes, melons, nectarines, oranges, peaches, pears, pomegranates, strawberries, tangerines  Fats - Replace butter and margarine with healthy oils, such as olive oil, canola oil, and tahini  - Limit nuts to no more than a handful a day  - Nuts include walnuts, almonds, pecans, pistachios, pine nuts  - Limit or avoid candied, honey roasted or heavily salted nuts - Olives are central to the [employer] - can be eaten whole or used in a variety of dishes   Meats Protein - Limiting red meat: no more than a few times a month - When eating red meat: choose lean cuts and keep the portion to the size of deck of cards - Eggs: approx. 0 to 4 times a week  - Fish and lean poultry: at least 2 a week  - Healthy protein sources include, chicken, [place], lean beef, lamb - Increase intake of seafood such as tuna, salmon, trout, mackerel, shrimp, scallops - Avoid or limit high fat processed meats such as sausage and bacon  Dairy - Include moderate amounts of low fat dairy products  - Focus on healthy dairy such as fat free yogurt, skim milk, low or reduced fat cheese - Limit dairy products higher in fat such as whole or 2% milk, cheese, ice cream  Alcohol - Moderate amounts of red wine is ok  - No more than 5 oz daily for women (all ages) and men older than age 18  - No more than 10 oz of wine daily for men younger than 98  Other - Limit sweets and other desserts  - Use herbs and spices instead of salt to flavor foods  - Herbs and spices common to the traditional Mediterranean Diet include: basil, bay leaves, chives, cloves, cumin, fennel, garlic, lavender, marjoram,  mint, oregano, parsley, pepper, rosemary, sage, savory, sumac, tarragon, thyme   It's not just a diet, it's a lifestyle:  . The Mediterranean diet includes lifestyle factors typical of those in the region  . Foods, drinks and meals are best eaten with others and savored . Daily physical activity is important for overall good health . This could be strenuous exercise like running and aerobics . This could also be more leisurely activities such as walking, housework, yard-work, or taking the stairs . Moderation is the key; a balanced and healthy diet accommodates most foods and drinks . Consider portion sizes and frequency of consumption of certain foods   Meal Ideas & Options:  . Breakfast:  o Whole wheat toast or whole wheat English muffins with peanut butter & hard boiled egg o Steel cut oats topped with apples & cinnamon and skim milk  o Fresh fruit: banana, strawberries, melon, berries, peaches  o Smoothies: strawberries, bananas, greek yogurt, peanut butter o Low fat greek yogurt with blueberries and granola  o Egg white omelet with spinach and mushrooms o Breakfast couscous: whole wheat couscous, apricots, skim milk, cranberries  . Sandwiches:  o Hummus and grilled vegetables (peppers, zucchini, squash) on whole wheat bread   o Grilled chicken on whole wheat pita with lettuce, tomatoes, cucumbers or tzatziki  o Tuna salad on whole wheat bread: tuna salad made with greek yogurt, olives, red peppers, capers, green onions o Garlic rosemary lamb pita: lamb sauted with garlic, rosemary, salt & pepper; add lettuce, cucumber, greek yogurt to pita - flavor with lemon juice and black pepper  .  Seafood:  o Mediterranean grilled salmon, seasoned with garlic, basil, parsley, lemon juice and black pepper o Shrimp, lemon, and spinach whole-grain pasta salad made with low fat greek yogurt  o Seared scallops with lemon orzo  o Seared tuna steaks seasoned salt, pepper, coriander topped with tomato  mixture of olives, tomatoes, olive oil, minced garlic, parsley, green onions and cappers  . Meats:  o Herbed greek chicken salad with kalamata olives, cucumber, feta  o Red bell peppers stuffed with spinach, bulgur, lean ground beef (or lentils) & topped with feta   o Kebabs: skewers of chicken, tomatoes, onions, zucchini, squash  o Turkey burgers: made with red onions, mint, dill, lemon juice, feta cheese topped with roasted red peppers . Vegetarian o Cucumber salad: cucumbers, artichoke hearts, celery, red onion, feta cheese, tossed in olive oil & lemon juice  o Hummus and whole grain pita points with a greek salad (lettuce, tomato, feta, olives, cucumbers, red onion) o Lentil soup with celery, carrots made with vegetable broth, garlic, salt and pepper  o Tabouli salad: parsley, bulgur, mint, scallions, cucumbers, tomato, radishes, lemon juice, olive oil, salt and pepper.      American Heart Association (AHA) Exercise Recommendation  Being physically active is important to prevent heart disease and stroke, the nation's No. 1and No. 5killers. To improve overall cardiovascular health, we suggest at least 150 minutes per week of moderate exercise or 75 minutes per week of vigorous exercise (or a combination of moderate and vigorous activity). Thirty minutes a day, five times a week is an easy goal to remember. You will also experience benefits even if you divide your time into two or three segments of 10 to 15 minutes per day.  For people who would benefit from lowering their blood pressure or cholesterol, we recommend 40 minutes of aerobic exercise of moderate to vigorous intensity three to four times a week to lower the risk for heart attack and stroke.  Physical activity is anything that makes you move your body and burn calories.  This includes things like climbing stairs or playing sports. Aerobic exercises benefit your heart, and include walking, jogging, swimming or biking. Strength and  stretching exercises are best for overall stamina and flexibility.  The simplest, positive change you can make to effectively improve your heart health is to start walking. It's enjoyable, free, easy, social and great exercise. A walking program is flexible and boasts high success rates because people can stick with it. It's easy for walking to become a regular and satisfying part of life.   For Overall Cardiovascular Health:  At least 30 minutes of moderate-intensity aerobic activity at least 5 days per week for a total of 150  OR   At least 25 minutes of vigorous aerobic activity at least 3 days per week for a total of 75 minutes; or a combination of moderate- and vigorous-intensity aerobic activity  AND   Moderate- to high-intensity muscle-strengthening activity at least 2 days per week for additional health benefits.  For Lowering Blood Pressure and Cholesterol  An average 40 minutes of moderate- to vigorous-intensity aerobic activity 3 or 4 times per week  What if I can't make it to the time goal? Something is always better than nothing! And everyone has to start somewhere. Even if you've been sedentary for years, today is the day you can begin to make healthy changes in your life. If you don't think you'll make it for 30 or 40 minutes, set a reachable goal for   today. You can work up toward your overall goal by increasing your time as you get stronger. Don't let all-or-nothing thinking rob you of doing what you can every day.  Source:http://www.heart.org    

## 2017-08-05 NOTE — Addendum Note (Signed)
Addended by: Garfield CorneaMABRY, JASMINE L on: 08/05/2017 10:01 AM   Modules accepted: SmartSet

## 2017-08-05 NOTE — Progress Notes (Signed)
Patient ID: Courtney Johnston, female   DOB: 1990/03/25, 27 y.o.   MRN: [id number]     Gynecology Annual Exam   PCP: Allegra GranaArnett, Margaret G, FNP  Chief Complaint:  Chief Complaint  Patient presents with  . Annual Exam    History of Present Illness: Patient is a 27 y.o. G1P1001 presents for annual exam. The patient has no complaints today. She is considering a pregnancy in the near future and has questions about birth control. Discussed pre-conception healthy lifestyle.  LMP: Patient's last menstrual period was 07/24/2017. Average Interval: regular, 28 days Duration of flow: 5 days Heavy Menses: no Clots: no Intermenstrual Bleeding: no Postcoital Bleeding: no Dysmenorrhea: no  The patient is sexually active. She currently uses OCP (estrogen/progesterone) for contraception. She denies dyspareunia.  The patient does perform self breast exams.  There is no notable family history of breast or ovarian cancer in her family.  The patient wears seatbelts: yes.   The patient has regular exercise: She is active with a toddler. Recommend increase of vigorous activity. She admits increased intake of soda. Recommend decreasing soda and increasing h2o..    The patient denies current symptoms of depression.    Review of Systems: Review of Systems  Constitutional: Negative.   HENT: Negative.   Eyes: Negative.   Respiratory: Negative.   Cardiovascular: Negative.   Gastrointestinal: Negative.   Genitourinary: Negative.   Musculoskeletal: Negative.   Skin: Negative.   Neurological: Negative.   Endo/Heme/Allergies: Negative.   Psychiatric/Behavioral: Negative.     Past Medical History:  Past Medical History:  Diagnosis Date  . Anemia   . History of chicken pox    27yrs old    Past Surgical History:  Past Surgical History:  Procedure Laterality Date  . 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 Contraception: OCP (estrogen/progesterone) Last Pap: 1 year ago Results were: no abnormalities   Obstetric History: G1P1001  Family History:  Family History  Problem Relation Age of Onset  . Arthritis Mother   . Heart disease Mother   . Hypertension Mother   . Asthma Father   . Early death Father        Drowning  . Hypertension Maternal Aunt   . Heart disease Maternal Uncle   . Hypertension Maternal Uncle   . Heart disease Maternal Grandmother   . Hypertension Maternal Grandmother   . Arthritis Maternal Grandfather   . Hypertension Maternal Grandfather   . Diabetes Paternal Grandfather     Social History:  Social History   Socioeconomic History  . Marital status: Single    Spouse name: Not on file  . Number of children: 0  . Years of education: 612  . Highest education level: Not on file  Occupational History  . Occupation: Caregiver  Social Needs  . Financial resource strain: Not on file  . Food insecurity:    Worry: Not on file    Inability: Not on file  . Transportation needs:    Medical: Not on file    Non-medical: Not on file  Tobacco Use  . Smoking status: Never Smoker  . Smokeless tobacco: Never Used  Substance and Sexual Activity  . Alcohol use: No    Comment: occasionally.   . Drug use: No  . Sexual activity: Yes    Birth control/protection: None  Lifestyle  .  Physical activity:    Days per week: Not on file    Minutes per session: Not on file  . Stress: Not on file  Relationships  . Social connections:    Talks on phone: Not on file    Gets together: Not on file    Attends religious service: Not on file    Active member of club or organization: Not on file    Attends meetings of clubs or organizations: Not on file    Relationship status: Not on file  . Intimate partner violence:    Fear of current or ex partner: Not on file    Emotionally abused: Not on file     Physically abused: Not on file    Forced sexual activity: Not on file  Other Topics Concern  . Not on file  Social History Narrative   Hobbies: Reading. She enjoys spending time with family   Caffeine: 1 cup of coffee daily   Exercise: once a week. Walking on treadmill    Allergies:  No Known Allergies  Medications: Prior to Admission medications   Medication Sig Start Date End Date Taking? Authorizing Provider  norgestimate-ethinyl estradiol (SPRINTEC 28) 0.25-35 MG-MCG tablet Take 1 tablet by mouth daily. 06/29/17 09/21/17 Yes Tresea Mall, CNM    Physical Exam Vitals: Blood pressure 122/78, pulse 91, height 5\' 6"  (1.676 m), weight 213 lb (96.6 kg), last menstrual period 07/24/2017, SpO2 97 %.  General: NAD HEENT: normocephalic, anicteric Thyroid: no enlargement, no palpable nodules Pulmonary: No increased work of breathing, CTAB Cardiovascular: RRR, distal pulses 2+ Breast: Breast symmetrical, no tenderness, no palpable nodules or masses, no skin or nipple retraction present, no nipple discharge.  No axillary or supraclavicular lymphadenopathy. Abdomen: NABS, soft, non-tender, non-distended.  Umbilicus without lesions.  No hepatomegaly, splenomegaly or masses palpable. No evidence of hernia  Genitourinary: deferred for no concerns, PAP interval/shared decision making Extremities: no edema, erythema, or tenderness Neurologic: Grossly intact Psychiatric: mood appropriate, affect full   Assessment: 27 y.o. G1P1001 routine annual exam  Plan: Problem List Items Addressed This Visit    None    Visit Diagnoses    Well woman exam without gynecological exam    -  Primary   Encounter for surveillance of contraceptive pills       Relevant Medications   norgestimate-ethinyl estradiol (SPRINTEC 28) 0.25-35 MG-MCG tablet      1) STI screening  was offered and declined  2)  ASCCP guidelines and rational discussed.  Patient opts for every 3 years screening interval  3)  Contraception - the patient is currently using  OCP (estrogen/progesterone).  She is happy with her current form of contraception and plans to continue until she attempts conception.   4) Routine healthcare maintenance including cholesterol, diabetes screening discussed managed by PCP  5) Return in 1 year (on 08/06/2018).   Tresea Mall, CNM [facility], [facility] 08/05/2017, 9:45 AM

## 2017-09-08 ENCOUNTER — Ambulatory Visit: Payer: Self-pay | Admitting: Family

## 2017-09-08 NOTE — Telephone Encounter (Signed)
Pt c/o left ear "muffled". Symptoms started last Thursday. Pt had left facial pain especially behind the left ear and headache to the left side of head. Pt denies headache at time of call.  Pt initially felt itching to the ear canal and began using  homeopathic ear drops. Pt stated the itching has subsided. Pt denies pain to the left ear. Pt denies sneezing or clear nasal discharge. Pt denies cough.  Care advice given to pt. Pt verbalized understanding.  Pt was asked to call if ear  worsens. Pt has appointment with Rennie Plowman NP tomorrow. Reason for Disposition . Ear congestion present > 48 hours  Answer Assessment - Initial Assessment Questions 1. LOCATION: "Which ear is involved?"       left 2. SENSATION: "Describe how the ear feels."      Fullness hearing muffled to the left ear 3. ONSET:  "When did the ear symptoms start?"       Last Thursday 4. PAIN: "Do you also have an earache?" If so, ask: "How bad is it?" (Scale 1-10; or mild, moderate, severe)     No  5. CAUSE: "What do you think is causing the ear congestion?"     No pt does not know 6. URI: "Do you have a runny nose or cough?"      no 7. NASAL ALLERGIES: "Are there symptoms of hay fever, such as sneezing or a clear nasal discharge?"     no 8. PREGNANCY: "Is there any chance you are pregnant?" "When was your last menstrual period?"     No LMP: 3 weeks ago  Protocols used: EAR - CONGESTION-A-AH

## 2017-09-09 ENCOUNTER — Ambulatory Visit (INDEPENDENT_AMBULATORY_CARE_PROVIDER_SITE_OTHER): Payer: Medicaid Other | Admitting: Family

## 2017-09-09 ENCOUNTER — Encounter: Payer: Self-pay | Admitting: Family

## 2017-09-09 VITALS — BP 130/95 | HR 94 | Temp 98.5°F | Resp 15 | Wt 213.5 lb

## 2017-09-09 DIAGNOSIS — R030 Elevated blood-pressure reading, without diagnosis of hypertension: Secondary | ICD-10-CM | POA: Insufficient documentation

## 2017-09-09 DIAGNOSIS — Z23 Encounter for immunization: Secondary | ICD-10-CM | POA: Diagnosis not present

## 2017-09-09 DIAGNOSIS — H9202 Otalgia, left ear: Secondary | ICD-10-CM | POA: Insufficient documentation

## 2017-09-09 MED ORDER — AMOXICILLIN-POT CLAVULANATE 875-125 MG PO TABS
1.0000 | ORAL_TABLET | Freq: Two times a day (BID) | ORAL | 0 refills | Status: DC
Start: 2017-09-09 — End: 2018-08-25

## 2017-09-09 NOTE — Progress Notes (Signed)
Subjective:    Patient ID: Courtney Johnston, female    DOB: 1990-07-16, 27 y.o.   MRN: [id number]  CC: Courtney Johnston is a 27 y.o. female who presents today for an acute visit.    HPI: Left ear fullness and pain x 6 days, notes improved last night.. Started with left ear itching. Endorses headache on left side of head, describes as 'throb' , it is not severe. Has been using ear drops. Feels like left ear wet inside-clear in color. No blood.  Hearing is muffled. No sore throat, cough, vision changes, congestion. No h/o seasonal allergies.   No recent swimming.   Single kidney- Hasnt had any follow up with Nephrology since pregnancy ; advised no f/u necessary at this time ( unless pregnant)       HISTORY:  Past Medical History:  Diagnosis Date  . Anemia   . History of chicken pox    27yrs old   Past Surgical History:  Procedure Laterality Date  . 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 Arthritis Mother   . Heart disease Mother   . Hypertension Mother   . Asthma Father   . Early death Father        Drowning  . Hypertension Maternal Aunt   . Heart disease Maternal Uncle   . Hypertension Maternal Uncle   . Heart disease Maternal Grandmother   . Hypertension Maternal Grandmother   . Arthritis Maternal Grandfather   . Hypertension Maternal Grandfather   . Diabetes Paternal Grandfather     Allergies: Patient has no known allergies. Current Outpatient Medications on File Prior to Visit  Medication Sig Dispense Refill  . norgestimate-ethinyl estradiol (SPRINTEC 28) 0.25-35 MG-MCG tablet Take 1 tablet by mouth daily. 84 tablet 4   No current facility-administered medications on file prior to visit.     Social History   Tobacco Use  . Smoking status: Never Smoker  . Smokeless tobacco: Never Used  Substance Use Topics  .  Alcohol use: No    Comment: occasionally.   . Drug use: No    Review of Systems  Constitutional: Negative for chills and fever.  HENT: Positive for ear discharge and ear pain. Negative for congestion, facial swelling and sore throat.   Eyes: Negative for visual disturbance.  Respiratory: Negative for cough.   Cardiovascular: Negative for chest pain and palpitations.  Gastrointestinal: Negative for nausea and vomiting.      Objective:    BP (!) 130/95   Pulse 94   Temp 98.5 F (36.9 C) (Oral)   Resp 15   Wt 213 lb 8 oz (96.8 kg)   SpO2 98%   BMI 34.46 kg/m  BP Readings from Last 3 Encounters:  09/09/17 (!) 130/95  08/05/17 122/78  07/08/17 120/86     Physical Exam  Constitutional: She appears well-developed and well-nourished.  HENT:  Head: Normocephalic and atraumatic.  Right Ear: Hearing, tympanic membrane, external ear and ear canal normal. No drainage, swelling or tenderness. No foreign bodies. Tympanic membrane is not erythematous and not bulging. No middle ear effusion. No decreased hearing is noted.  Left Ear: Hearing, tympanic membrane, external ear and ear canal normal. No drainage, swelling or tenderness. No foreign bodies. Tympanic membrane is not erythematous and not bulging.  No middle ear effusion. No decreased hearing is noted.  Nose: Nose normal. No rhinorrhea. Right sinus exhibits no maxillary sinus tenderness and no frontal sinus tenderness. Left sinus exhibits no maxillary sinus tenderness and no frontal sinus tenderness.  Mouth/Throat: Uvula is midline, oropharynx is clear and moist and mucous membranes are normal. No oropharyngeal exudate, posterior oropharyngeal edema, posterior oropharyngeal erythema or tonsillar abscesses.  Eyes: Conjunctivae are normal.  Cardiovascular: Regular rhythm, normal heart sounds and normal pulses.  Pulmonary/Chest: Effort normal and breath sounds normal. She has no wheezes. She has no rhonchi. She has no rales.    Lymphadenopathy:       Head (right side): No submental, no submandibular, no tonsillar, no preauricular, no posterior auricular and no occipital adenopathy present.       Head (left side): No submental, no submandibular, no tonsillar, no preauricular, no posterior auricular and no occipital adenopathy present.    She has no cervical adenopathy.  Neurological: She is alert.  Skin: Skin is warm and dry.  Psychiatric: She has a normal mood and affect. Her speech is normal and behavior is normal. Thought content normal.  Vitals reviewed.      Assessment & Plan:   Problem List Items Addressed This Visit      Other   Ear pain, left - Primary    Improved, benign exam. Suspect viral. Advised flonase. If no improvement, she will start augmentin. She will let me know how she is doing.       Relevant Medications   amoxicillin-clavulanate (AUGMENTIN) 875-125 MG tablet   Elevated blood pressure reading    Elevated is new based on prior readings. Suspect ear pain may be contributory. Close vigilance and she will keep BP log and let me know if persistent.        Other Visit Diagnoses    Need for immunization against influenza       Relevant Orders   Flu Vaccine QUAD 36+ mos IM (Completed)         I am having Pamelyn M. Looney start on amoxicillin-clavulanate. I am also having her maintain her norgestimate-ethinyl estradiol.   Meds ordered this encounter  Medications  . amoxicillin-clavulanate (AUGMENTIN) 875-125 MG tablet    Sig: Take 1 tablet by mouth 2 (two) times daily.    Dispense:  14 tablet    Refill:  0    Order Specific Question:   Supervising Provider    Answer:   Sherlene Shams [[id number]]    Return precautions given.   Risks, benefits, and alternatives of the medications and treatment plan prescribed today were discussed, and patient expressed understanding.   Education regarding symptom management and diagnosis given to patient on AVS.  Continue to follow with  Allegra Grana, FNP for routine health maintenance.   Valeen Ardelle Balls and I agreed with plan.   Rennie Plowman, FNP

## 2017-09-09 NOTE — Assessment & Plan Note (Addendum)
Improved, benign exam. Suspect viral. Advised flonase. If no improvement, she will start augmentin. She will let me know how she is doing.

## 2017-09-09 NOTE — Patient Instructions (Addendum)
Start flonase  I suspect that your infection is viral in nature.  As discussed, I advise that you wait to fill the antibiotic after 1-2 days of symptom management to see if your symptoms improve. If you do not show improvement, you may take the antibiotic as prescribed.  Please note that antibiotics can render birth control less effective.  Please use back-up contraceptive while on antibiotic.  Marland Kitchen

## 2017-09-09 NOTE — Assessment & Plan Note (Signed)
Elevated is new based on prior readings. Suspect ear pain may be contributory. Close vigilance and she will keep BP log and let me know if persistent.

## 2017-09-10 ENCOUNTER — Ambulatory Visit: Payer: Medicaid Other | Admitting: Family Medicine

## 2018-08-25 ENCOUNTER — Other Ambulatory Visit: Payer: Self-pay

## 2018-08-25 ENCOUNTER — Encounter: Payer: Self-pay | Admitting: Advanced Practice Midwife

## 2018-08-25 ENCOUNTER — Ambulatory Visit (INDEPENDENT_AMBULATORY_CARE_PROVIDER_SITE_OTHER): Payer: Medicaid Other | Admitting: Advanced Practice Midwife

## 2018-08-25 DIAGNOSIS — Z0000 Encounter for general adult medical examination without abnormal findings: Secondary | ICD-10-CM

## 2018-08-25 DIAGNOSIS — Z3041 Encounter for surveillance of contraceptive pills: Secondary | ICD-10-CM

## 2018-08-25 DIAGNOSIS — Z01419 Encounter for gynecological examination (general) (routine) without abnormal findings: Secondary | ICD-10-CM | POA: Diagnosis not present

## 2018-08-25 MED ORDER — NORGESTIMATE-ETH ESTRADIOL 0.25-35 MG-MCG PO TABS: 1.0000 | ORAL_TABLET | Freq: Every day | ORAL | 4 refills | Status: DC

## 2018-08-25 NOTE — Patient Instructions (Signed)
Health Maintenance, Female Adopting a healthy lifestyle and getting preventive care are important in promoting health and wellness. Ask your health care provider about:  The right schedule for you to have regular tests and exams.  Things you can do on your own to prevent diseases and keep yourself healthy. What should I know about diet, weight, and exercise? Eat a healthy diet   Eat a diet that includes plenty of vegetables, fruits, low-fat dairy products, and lean protein.  Do not eat a lot of foods that are high in solid fats, added sugars, or sodium. Maintain a healthy weight Body mass index (BMI) is used to identify weight problems. It estimates body fat based on height and weight. Your health care provider can help determine your BMI and help you achieve or maintain a healthy weight. Get regular exercise Get regular exercise. This is one of the most important things you can do for your health. Most adults should:  Exercise for at least 150 minutes each week. The exercise should increase your heart rate and make you sweat (moderate-intensity exercise).  Do strengthening exercises at least twice a week. This is in addition to the moderate-intensity exercise.  Spend less time sitting. Even light physical activity can be beneficial. Watch cholesterol and blood lipids Have your blood tested for lipids and cholesterol at 30 years of age, then have this test every 5 years. Have your cholesterol levels checked more often if:  Your lipid or cholesterol levels are high.  You are older than 30 years of age.  You are at high risk for heart disease. What should I know about cancer screening? Depending on your health history and family history, you may need to have cancer screening at various ages. This may include screening for:  Breast cancer.  Cervical cancer.  Colorectal cancer.  Skin cancer.  Lung cancer. What should I know about heart disease, diabetes, and high blood  pressure? Blood pressure and heart disease  High blood pressure causes heart disease and increases the risk of stroke. This is more likely to develop in people who have high blood pressure readings, are of African descent, or are overweight.  Have your blood pressure checked: ? Every 3-5 years if you are 18-39 years of age. ? Every year if you are 40 years old or older. Diabetes Have regular diabetes screenings. This checks your fasting blood sugar level. Have the screening done:  Once every three years after age 40 if you are at a normal weight and have a low risk for diabetes.  More often and at a younger age if you are overweight or have a high risk for diabetes. What should I know about preventing infection? Hepatitis B If you have a higher risk for hepatitis B, you should be screened for this virus. Talk with your health care provider to find out if you are at risk for hepatitis B infection. Hepatitis C Testing is recommended for:  Everyone born from 1945 through 1965.  Anyone with known risk factors for hepatitis C. Sexually transmitted infections (STIs)  Get screened for STIs, including gonorrhea and chlamydia, if: ? You are sexually active and are younger than 30 years of age. ? You are older than 30 years of age and your health care provider tells you that you are at risk for this type of infection. ? Your sexual activity has changed since you were last screened, and you are at increased risk for chlamydia or gonorrhea. Ask your health care provider if   you are at risk.  Ask your health care provider about whether you are at high risk for HIV. Your health care provider may recommend a prescription medicine to help prevent HIV infection. If you choose to take medicine to prevent HIV, you should first get tested for HIV. You should then be tested every 3 months for as long as you are taking the medicine. Pregnancy  If you are about to stop having your period (premenopausal) and  you may become pregnant, seek counseling before you get pregnant.  Take 400 to 800 micrograms (mcg) of folic acid every day if you become pregnant.  Ask for birth control (contraception) if you want to prevent pregnancy. Osteoporosis and menopause Osteoporosis is a disease in which the bones lose minerals and strength with aging. This can result in bone fractures. If you are 65 years old or older, or if you are at risk for osteoporosis and fractures, ask your health care provider if you should:  Be screened for bone loss.  Take a calcium or vitamin D supplement to lower your risk of fractures.  Be given hormone replacement therapy (HRT) to treat symptoms of menopause. Follow these instructions at home: Lifestyle  Do not use any products that contain nicotine or tobacco, such as cigarettes, e-cigarettes, and chewing tobacco. If you need help quitting, ask your health care provider.  Do not use street drugs.  Do not share needles.  Ask your health care provider for help if you need support or information about quitting drugs. Alcohol use  Do not drink alcohol if: ? Your health care provider tells you not to drink. ? You are pregnant, may be pregnant, or are planning to become pregnant.  If you drink alcohol: ? Limit how much you use to 0-1 drink a day. ? Limit intake if you are breastfeeding.  Be aware of how much alcohol is in your drink. In the [place] one drink equals one 12 oz bottle of beer (355 mL), one 5 oz glass of wine (148 mL), or one 1 oz glass of hard liquor (44 mL). General instructions  Schedule regular health, dental, and eye exams.  Stay current with your vaccines.  Tell your health care provider if: ? You often feel depressed. ? You have ever been abused or do not feel safe at home. Summary  Adopting a healthy lifestyle and getting preventive care are important in promoting health and wellness.  Follow your health care provider's instructions about healthy  diet, exercising, and getting tested or screened for diseases.  Follow your health care provider's instructions on monitoring your cholesterol and blood pressure. This information is not intended to replace advice given to you by your health care provider. Make sure you discuss any questions you have with your health care provider. Document Released: 07/08/2010 Document Revised: 12/16/2017 Document Reviewed: 12/16/2017 Elsevier Patient Education  2020 [employer]  

## 2018-08-25 NOTE — Progress Notes (Signed)
Gynecology Annual Exam   PCP: Allegra GranaArnett, Margaret G, FNP  Chief Complaint:  Chief Complaint  Patient presents with  . Annual Exam    History of Present Illness: Patient is a 28 y.o. G1P1001 presents for annual exam. The patient has no complaints today. She is still planning to have another child. Her partner is not ready yet.   LMP: Patient's last menstrual period was 08/22/2018. Average Interval: regular, 28 days Duration of flow: 4 days Heavy Menses: no Clots: no Intermenstrual Bleeding: no Postcoital Bleeding: no Dysmenorrhea: no  The patient is sexually active. She currently uses OCP (estrogen/progesterone) for contraception. She denies dyspareunia.  The patient does perform self breast exams.  There is no notable family history of breast or ovarian cancer in her family.  The patient wears seatbelts: yes.   The patient has regular exercise: She walks 3 miles most days of the week. She is trying to improve her diet. She admits adequate hydration and sleep.    The patient denies current symptoms of depression.    Review of Systems: Review of Systems  Constitutional: Negative.   HENT: Negative.   Eyes: Negative.   Respiratory: Negative.   Cardiovascular: Negative.   Gastrointestinal: Negative.   Genitourinary: Negative.   Musculoskeletal: Negative.   Skin: Negative.   Neurological: Negative.   Endo/Heme/Allergies: Negative.   Psychiatric/Behavioral: Negative.     Past Medical History:  Past Medical History:  Diagnosis Date  . Anemia   . History of chicken pox    [age]yrs old    Past Surgical History:  Past Surgical History:  Procedure Laterality Date  . 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 Contraception: OCP (estrogen/progesterone) Last Pap: 2 years ago Results were: no abnormalities   Obstetric History:  G1P1001  Family History:  Family History  Problem Relation Age of Onset  . Arthritis Mother   . Heart disease Mother   . Hypertension Mother   . Asthma Father   . Early death Father        Drowning  . Hypertension Maternal Aunt   . Heart disease Maternal Uncle   . Hypertension Maternal Uncle   . Heart disease Maternal Grandmother   . Hypertension Maternal Grandmother   . Arthritis Maternal Grandfather   . Hypertension Maternal Grandfather   . Diabetes Paternal Grandfather     Social History:  Social History   Socioeconomic History  . Marital status: Single    Spouse name: Not on file  . Number of children: 0  . Years of education: 6212  . Highest education level: Not on file  Occupational History  . Occupation: Caregiver  Social Needs  . Financial resource strain: Not on file  . Food insecurity    Worry: Not on file    Inability: Not on file  . Transportation needs    Medical: Not on file    Non-medical: Not on file  Tobacco Use  . Smoking status: Never Smoker  . Smokeless tobacco: Never Used  Substance and Sexual Activity  . Alcohol use: No    Comment: occasionally.   . Drug use: No  . Sexual activity: Yes    Birth control/protection: None  Lifestyle  . Physical activity    Days per week: Not on file    Minutes per session: Not on file  .  Stress: Not on file  Relationships  . Social Herbalist on phone: Not on file    Gets together: Not on file    Attends religious service: Not on file    Active member of club or organization: Not on file    Attends meetings of clubs or organizations: Not on file    Relationship status: Not on file  . Intimate partner violence    Fear of current or ex partner: Not on file    Emotionally abused: Not on file    Physically abused: Not on file    Forced sexual activity: Not on file  Other Topics Concern  . Not on file  Social History Narrative   Hobbies: Reading. She enjoys spending time with family    Caffeine: 1 cup of coffee daily   Exercise: once a week. Walking on treadmill    Allergies:  No Known Allergies  Medications: Prior to Admission medications   Medication Sig Start Date End Date Taking? Authorizing Provider  norgestimate-ethinyl estradiol (SPRINTEC 28) 0.25-35 MG-MCG tablet Take 1 tablet by mouth daily. 08/25/18 10/19/19 Yes Rod Can, CNM    Physical Exam Vitals: Blood pressure 124/78, height 5\' 5"  (1.651 m), weight 204 lb (92.5 kg), last menstrual period 08/22/2018.  General: NAD HEENT: normocephalic, anicteric Thyroid: no enlargement, no palpable nodules Pulmonary: No increased work of breathing, CTAB Cardiovascular: RRR, distal pulses 2+ Breast: Breast symmetrical, no tenderness, no palpable nodules or masses, no skin or nipple retraction present, no nipple discharge.  No axillary or supraclavicular lymphadenopathy. Abdomen: NABS, soft, non-tender, non-distended.  Umbilicus without lesions.  No hepatomegaly, splenomegaly or masses palpable. No evidence of hernia  Genitourinary: deferred for no concerns/PAP interval Extremities: no edema, erythema, or tenderness Neurologic: Grossly intact Psychiatric: mood appropriate, affect full    Assessment: 28 y.o. G1P1001 routine annual exam  Plan: Problem List Items Addressed This Visit    None    Visit Diagnoses    Encounter for surveillance of contraceptive pills       Relevant Medications   norgestimate-ethinyl estradiol (SPRINTEC 28) 0.25-35 MG-MCG tablet      2) STI screening  was offered and declined  2)  ASCCP guidelines and rationale discussed.  Patient opts for every 3 years screening interval  3) Contraception - the patient is currently using  OCP (estrogen/progesterone).  She is happy with her current form of contraception and plans to continue  4) Routine healthcare maintenance including cholesterol, diabetes screening discussed Declines  5) Return in 1 year (on 08/25/2019) for annual  established gyn.   Rod Can, [facility] 08/25/2018, 8:42 AM

## 2019-09-14 ENCOUNTER — Ambulatory Visit: Payer: Medicaid Other | Admitting: Advanced Practice Midwife

## 2019-09-29 ENCOUNTER — Other Ambulatory Visit (HOSPITAL_COMMUNITY)
Admission: RE | Admit: 2019-09-29 | Discharge: 2019-09-29 | Disposition: A | Discharge status: Home / Self Care | Payer: Medicaid Other | Source: Ambulatory Visit | Attending: Advanced Practice Midwife | Admitting: Advanced Practice Midwife

## 2019-09-29 ENCOUNTER — Ambulatory Visit (INDEPENDENT_AMBULATORY_CARE_PROVIDER_SITE_OTHER): Payer: Medicaid Other | Admitting: Advanced Practice Midwife

## 2019-09-29 ENCOUNTER — Other Ambulatory Visit: Payer: Self-pay

## 2019-09-29 ENCOUNTER — Encounter: Payer: Self-pay | Admitting: Advanced Practice Midwife

## 2019-09-29 VITALS — BP 122/74 | Ht 63.0 in | Wt 203.0 lb

## 2019-09-29 DIAGNOSIS — Z01419 Encounter for gynecological examination (general) (routine) without abnormal findings: Secondary | ICD-10-CM | POA: Diagnosis not present

## 2019-09-29 DIAGNOSIS — Z124 Encounter for screening for malignant neoplasm of cervix: Secondary | ICD-10-CM | POA: Insufficient documentation

## 2019-09-29 DIAGNOSIS — Z3041 Encounter for surveillance of contraceptive pills: Secondary | ICD-10-CM

## 2019-09-29 MED ORDER — NORGESTIMATE-ETH ESTRADIOL 0.25-35 MG-MCG PO TABS: 1.0000 | ORAL_TABLET | Freq: Every day | ORAL | 4 refills | Status: DC

## 2019-09-29 NOTE — Progress Notes (Signed)
Gynecology Annual Exam   PCP: Allegra Grana, FNP  Chief Complaint:  Chief Complaint  Patient presents with  . Annual Exam    History of Present Illness: Patient is a 29 y.o. G1P1001 presents for annual exam. The patient has no gyn complaints today.   LMP: Patient's last menstrual period was 09/18/2019. Average Interval: regular, 28 days Duration of flow: 5 days Heavy Menses: no Clots: no Intermenstrual Bleeding: no Postcoital Bleeding: no Dysmenorrhea: no  The patient is sexually active. She currently uses OCP (estrogen/progesterone) for contraception. She denies dyspareunia.  The patient does perform self breast exams.  There is no notable family history of breast or ovarian cancer in her family.  The patient wears seatbelts: yes.   The patient has regular exercise: she walks some days. She admits healthy diet and hydration. She admits adequate sleep.    The patient denies current symptoms of depression.    Review of Systems: Review of Systems  Constitutional: Negative for chills and fever.  HENT: Negative for congestion, ear discharge, ear pain, hearing loss, sinus pain and sore throat.   Eyes: Negative for blurred vision and double vision.  Respiratory: Negative for cough, shortness of breath and wheezing.   Cardiovascular: Negative for chest pain, palpitations and leg swelling.  Gastrointestinal: Negative for abdominal pain, blood in stool, constipation, diarrhea, heartburn, melena, nausea and vomiting.  Genitourinary: Negative for dysuria, flank pain, frequency, hematuria and urgency.  Musculoskeletal: Negative for back pain, joint pain and myalgias.  Skin: Negative for itching and rash.  Neurological: Negative for dizziness, tingling, tremors, sensory change, speech change, focal weakness, seizures, loss of consciousness, weakness and headaches.  Endo/Heme/Allergies: Negative for environmental allergies. Does not bruise/bleed easily.  Psychiatric/Behavioral:  Negative for depression, hallucinations, memory loss, substance abuse and suicidal ideas. The patient is not nervous/anxious and does not have insomnia.     Past Medical History:  Patient Active Problem List   Diagnosis Date Noted  . Ear pain, left 09/09/2017  . Elevated blood pressure reading 09/09/2017  . Dysuria 07/11/2017  . Right flank pain 10/11/2015  . CKD (chronic kidney disease) stage 1, GFR 90 ml/min or greater 10/26/2013  . Routine general medical examination at a health care facility 09/15/2013  . Congenital single kidney 09/15/2013    Past Surgical History:  Past Surgical History:  Procedure Laterality Date  . 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 Contraception: OCP (estrogen/progesterone) Last Pap: 3 years ago Results were: no abnormalities   Obstetric History: G1P1001  Family History:  Family History  Problem Relation Age of Onset  . Arthritis Mother   . Heart disease Mother   . Hypertension Mother   . Asthma Father   . Early death Father        Drowning  . Hypertension Maternal Aunt   . Heart disease Maternal Uncle   . Hypertension Maternal Uncle   . Heart disease Maternal Grandmother   . Hypertension Maternal Grandmother   . Arthritis Maternal Grandfather   . Hypertension Maternal Grandfather   . Diabetes Paternal Grandfather     Social History:  Social History   Socioeconomic History  . Marital status: Single    Spouse name: Not on file  . Number of children: 0  . Years of education: 45  . Highest education level: Not on file  Occupational History  . Occupation: Caregiver  Tobacco Use  . Smoking status: Never Smoker  . Smokeless tobacco: Never Used  Substance and Sexual Activity  . Alcohol use: No    Comment: occasionally.   . Drug use: No  . Sexual activity: Yes    Birth  control/protection: Pill  Other Topics Concern  . Not on file  Social History Narrative   Hobbies: Reading. She enjoys spending time with family   Caffeine: 1 cup of coffee daily   Exercise: once a week. Walking on treadmill   Social Determinants of Health   Financial Resource Strain:   . Difficulty of Paying Living Expenses: Not on file  Food Insecurity:   . Worried About Programme researcher, broadcasting/film/video in the Last Year: Not on file  . Ran Out of Food in the Last Year: Not on file  Transportation Needs:   . Lack of Transportation (Medical): Not on file  . Lack of Transportation (Non-Medical): Not on file  Physical Activity:   . Days of Exercise per Week: Not on file  . Minutes of Exercise per Session: Not on file  Stress:   . Feeling of Stress : Not on file  Social Connections:   . Frequency of Communication with Friends and Family: Not on file  . Frequency of Social Gatherings with Friends and Family: Not on file  . Attends Religious Services: Not on file  . Active Member of Clubs or Organizations: Not on file  . Attends Banker Meetings: Not on file  . Marital Status: Not on file  Intimate Partner Violence:   . Fear of Current or Ex-Partner: Not on file  . Emotionally Abused: Not on file  . Physically Abused: Not on file  . Sexually Abused: Not on file    Allergies:  No Known Allergies  Medications: Prior to Admission medications   Medication Sig Start Date End Date Taking? Authorizing Provider  norgestimate-ethinyl estradiol (SPRINTEC 28) 0.25-35 MG-MCG tablet Take 1 tablet by mouth daily. 08/25/18 10/19/19  Tresea Mall, CNM    Physical Exam Vitals: Blood pressure 122/74, height 5\' 3"  (1.6 m), weight 203 lb (92.1 kg), last menstrual period 09/18/2019.  General: NAD HEENT: normocephalic, anicteric Thyroid: no enlargement, no palpable nodules Pulmonary: No increased work of breathing, CTAB Cardiovascular: RRR, distal pulses 2+ Breast: Breast symmetrical, no  tenderness, no palpable nodules or masses, no skin or nipple retraction present, no nipple discharge.  No axillary or supraclavicular lymphadenopathy. Abdomen: NABS, soft, non-tender, non-distended.  Umbilicus without lesions.  No hepatomegaly, splenomegaly or masses palpable. No evidence of hernia  Genitourinary:  External: Normal external female genitalia.  Normal urethral meatus, normal Bartholin's and Skene's glands.    Vagina: Normal vaginal mucosa, no evidence of prolapse.    Cervix: 1 cm Nabothian cyst at 8:30 similar to appearance 3 years ago, no bleeding, no CMT  Uterus: Non-enlarged, mobile, normal contour.    Adnexa: ovaries non-enlarged, no adnexal masses  Rectal: deferred  Lymphatic: no evidence of inguinal lymphadenopathy Extremities: no edema, erythema, or tenderness Neurologic: Grossly intact Psychiatric: mood appropriate, affect full    Assessment: 29 y.o. G1P1001 routine annual exam  Plan: Problem List Items Addressed This Visit    None    Visit Diagnoses    Well woman exam with routine gynecological exam    -  Primary   Relevant Orders   Cytology - PAP   Cervical cancer screening       Relevant Orders   Cytology -  PAP   Encounter for surveillance of contraceptive pills       Relevant Medications   norgestimate-ethinyl estradiol (SPRINTEC 28) 0.25-35 MG-MCG tablet      1) STI screening  was offered and declined  2)  ASCCP guidelines and rationale discussed.  Patient opts for every 3 years screening interval  3) Contraception - the patient is currently using  OCP (estrogen/progesterone).  She is happy with her current form of contraception and plans to continue  4) Routine healthcare maintenance including cholesterol, diabetes screening discussed Declines  5) Return in about 1 year (around 09/28/2020) for annual established gyn.    Tresea Mall, CNM [facility] 09/29/2019, 5:08 PM

## 2019-10-04 LAB — CYTOLOGY - PAP
Comment: NEGATIVE
Diagnosis: NEGATIVE
High risk HPV: NEGATIVE

## 2020-07-04 ENCOUNTER — Other Ambulatory Visit (HOSPITAL_COMMUNITY)
Admission: RE | Admit: 2020-07-04 | Discharge: 2020-07-04 | Disposition: A | Discharge status: Home / Self Care | Payer: Medicaid Other | Source: Ambulatory Visit | Attending: Obstetrics and Gynecology | Admitting: Obstetrics and Gynecology

## 2020-07-04 ENCOUNTER — Ambulatory Visit (INDEPENDENT_AMBULATORY_CARE_PROVIDER_SITE_OTHER): Payer: Medicaid Other | Admitting: Obstetrics and Gynecology

## 2020-07-04 ENCOUNTER — Encounter: Payer: Self-pay | Admitting: Obstetrics and Gynecology

## 2020-07-04 ENCOUNTER — Other Ambulatory Visit: Payer: Self-pay

## 2020-07-04 VITALS — BP 130/72 | Ht 67.0 in | Wt 205.4 lb

## 2020-07-04 DIAGNOSIS — Z23 Encounter for immunization: Secondary | ICD-10-CM

## 2020-07-04 DIAGNOSIS — Z3480 Encounter for supervision of other normal pregnancy, unspecified trimester: Secondary | ICD-10-CM | POA: Insufficient documentation

## 2020-07-04 DIAGNOSIS — N926 Irregular menstruation, unspecified: Secondary | ICD-10-CM | POA: Diagnosis not present

## 2020-07-04 LAB — POCT URINALYSIS DIPSTICK OB
Glucose, UA: NEGATIVE
POC,PROTEIN,UA: NEGATIVE

## 2020-07-04 LAB — POCT URINE PREGNANCY: Preg Test, Ur: POSITIVE — AB

## 2020-07-04 LAB — OB RESULTS CONSOLE VARICELLA ZOSTER ANTIBODY, IGG: Varicella: IMMUNE

## 2020-07-04 NOTE — Progress Notes (Signed)
07/04/2020   Chief Complaint: Missed period  Transfer of Care Patient: no  History of Present Illness: Ms. Common is a 30 y.o. G2P1001 [redacted]w[redacted]d based on Patient's last menstrual period was 05/10/2020. with an Estimated Date of Delivery: 02/14/21, with the above CC.   Her periods were: monthly She was using oral contraceptives (estrogen/progesterone) when she conceived.  She has Negative signs or symptoms of nausea/vomiting of pregnancy. She has Positive signs or symptoms of miscarriage or preterm labor She was not taking different medications around the time she conceived/early pregnancy. Since her LMP, she has not used alcohol Since her LMP, she has not used tobacco products Since her LMP, she has not used illegal drugs.    Current or past history of domestic violence. no  Infection History:  1. Since her LMP, she has not had a viral illness.  2. She reports close contact with children on a regular basis      3. She has a history of chicken pox. She reports vaccination for chicken pox in the past. 4. Patient or partner has history of genital herpes  no 5. History of STI (GC, CT, HPV, syphilis, HIV)  no   6.  She does not live with someone with TB or TB exposed. 7. History of recent travel :  no 8. She identifies Negative Zika risk factors for her and her partner 51. There are not cats in the home in the home.  She understands that while pregnant she should not change cat litter.   Genetic Screening Questions: (Includes patient, baby's father, or anyone in either family)   1. Patient's age >/= 61 at [facility]  no 2. Thalassemia ([place], [place], Mediterranean, or Asian background): MCV<80  no 3. Neural tube defect (meningomyelocele, spina bifida, anencephaly)  no 4. Congenital heart defect  no  5. Down syndrome  no 6. Tay-Sachs (Jewish, [place]  no 7. Canavan's Disease  no 8. Sickle cell disease or trait (African)  no  9. Hemophilia or other blood disorders  no  10. Muscular  dystrophy  no  11. Cystic fibrosis  no  12. Huntington's Chorea  no  13. Mental retardation/autism  no 14. Other inherited genetic or chromosomal disorder  no 15. Maternal metabolic disorder (DM, PKU, etc)  no 16. Patient or FOB with a child with a birth defect not listed above -She only has one kidney 16a. Patient or FOB with a birth defect themselves She was born with one kidney 52. Recurrent pregnancy loss, or stillbirth  no  18. Any medications since LMP other than prenatal vitamins (include vitamins, supplements, OTC meds, drugs, alcohol)  no 19. Any other genetic/environmental exposure to discuss  no  ROS:  Review of Systems  Constitutional:  Negative for chills, fever, malaise/fatigue and weight loss.  HENT:  Negative for congestion, hearing loss and sinus pain.   Eyes:  Negative for blurred vision and double vision.  Respiratory:  Negative for cough, sputum production, shortness of breath and wheezing.   Cardiovascular:  Negative for chest pain, palpitations, orthopnea and leg swelling.  Gastrointestinal:  Negative for abdominal pain, constipation, diarrhea, nausea and vomiting.  Genitourinary:  Negative for dysuria, flank pain, frequency, hematuria and urgency.  Musculoskeletal:  Negative for back pain, falls and joint pain.  Skin:  Negative for itching and rash.  Neurological:  Negative for dizziness and headaches.  Psychiatric/Behavioral:  Negative for depression, substance abuse and suicidal ideas. The patient is not nervous/anxious.    OBGYN History: As per  HPI. OB History  Gravida Para Term Preterm AB Living  2 1 1     1   SAB IAB Ectopic Multiple Live Births        0 1    # Outcome Date GA Lbr Len/2nd Weight Sex Delivery Anes PTL Lv  2 Current           1 Term 05/11/14 [redacted]w[redacted]d 25:45 / 03:04 8 lb 5.7 oz (3.79 kg) M Vag-Spont EPI  LIV     Birth Comments: facial bruising    Any issues with any prior pregnancies: no Any prior children are healthy, doing well, without  any problems or issues: yes Last pap smear 2021 NIL  History of STIs: No   Past Medical History: Past Medical History:  Diagnosis Date   Anemia    History of chicken pox    30yrs old    Past Surgical History: Past Surgical History:  Procedure Laterality Date   KIDNEY SURGERY Right 1990   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   Social History:  Social History   Socioeconomic History   Marital status: Single    Spouse name: Not on file   Number of children: 0   Years of education: 12   Highest education level: Not on file  Occupational History   Occupation: Caregiver  Tobacco Use   Smoking status: Never   Smokeless tobacco: Never  Substance and Sexual Activity   Alcohol use: No    Comment: occasionally.    Drug use: No   Sexual activity: Yes    Birth control/protection: Pill  Other Topics Concern   Not on file  Social History Narrative   Hobbies: Reading. She enjoys spending time with family   Caffeine: 1 cup of coffee daily   Exercise: once a week. Walking on treadmill   Social Determinants of Health   Financial Resource Strain: Not on file  Food Insecurity: Not on file  Transportation Needs: Not on file  Physical Activity: Not on file  Stress: Not on file  Social Connections: Not on file  Intimate Partner Violence: Not on file    Allergy: No Known Allergies  Current  Outpatient Medications:  Current Outpatient Medications:    norgestimate-ethinyl estradiol (SPRINTEC 28) 0.25-35 MG-MCG tablet, Take 1 tablet by mouth daily. (Patient not taking: Reported on 07/04/2020), Disp: 84 tablet, Rfl: 4   Physical Exam: Physical Exam Vitals and nursing note reviewed.  HENT:     Head: Normocephalic and atraumatic.  Eyes:     Pupils: Pupils are equal, round, and reactive to light.  Neck:     Thyroid: No thyromegaly.  Cardiovascular:     Rate and Rhythm: Normal rate and regular rhythm.  Pulmonary:     Effort: Pulmonary effort is normal.  Abdominal:  General: Bowel sounds are normal. There is no distension.     Palpations: Abdomen is soft.     Tenderness: There is no abdominal tenderness. There is no guarding or rebound.  Genitourinary:    Comments: External: Normal appearing vulva. No lesions noted.  Bimanual examination: Uterus midline, non-tender, normal in size, shape and contour.  No CMT. No adnexal masses. No adnexal tenderness. Pelvis not fixed.  Musculoskeletal:        General: Normal range of motion.     Cervical back: Normal range of motion and neck supple.  Skin:    General: Skin is warm and dry.  Neurological:     Mental Status: She is alert and oriented to person, place, and time.  Psychiatric:        Mood and Affect: Affect normal.        Judgment: Judgment normal.    Assessment: Ms. Furey is a 30 y.o. G2P1001 [redacted]w[redacted]d based on Patient's last menstrual period was 05/10/2020. with an Estimated Date of Delivery: 02/14/21,  for prenatal care.  Plan:  1) Avoid alcoholic beverages. 2) Patient encouraged not to smoke.  3) Discontinue the use of all non-medicinal drugs and chemicals.  4) Take prenatal vitamins daily.  5) Seatbelt use advised 6) Nutrition, food safety (fish, cheese advisories, and high nitrite foods) and exercise discussed. 7) Hospital and practice style delivering at [facility] discussed  8) Patient is asked about travel to areas at  risk for the Zika virus, and counseled to avoid travel and exposure to mosquitoes or sexual partners who may have themselves been exposed to the virus. Testing is discussed, and will be ordered as appropriate.  9) Childbirth classes at [facility] advised 10) Genetic Screening, such as with 1st Trimester Screening, cell free fetal DNA, AFP testing, and Ultrasound, as well as with amniocentesis and CVS as appropriate, is discussed with patient. She plans to have genetic testing this pregnancy.  Transvaginal US: IUP seen, CRL 8 weeks 0 days, FHR 171 BPM.  New OB labs today. Discussed initiation of 81 mg aspirin at [redacted] weeks gestational age. Encouraged patient to follow-up with her nephrologist who she saw throughout her last pregnancy. Will check CMP and protein creatinine ratio today  Problem list reviewed and updated.  I discussed the assessment and treatment plan with the patient. The patient was provided an opportunity to ask questions and all were answered. The patient agreed with the plan and demonstrated an understanding of the instructions.  Adelene Idler MD [facility], [facility] 07/04/2020 2:56 PM

## 2020-07-04 NOTE — Patient Instructions (Signed)
Obstetrics: Normal and Problem Pregnancies (7th ed., pp. 102-121). [place], [place]: Elsevier."> Textbook of Family Medicine (9th ed., pp. 365-410). [place], [place]: [employer].">  First Trimester of Pregnancy  The first trimester of pregnancy starts on the first day of your last menstrual period until the end of week 12. This is months 1 through 3 of pregnancy. A week after a sperm fertilizes an egg, the egg will implant into the wall of the uterus and begin to develop into a baby. By the end of 12 weeks, all the baby'sorgans will be formed and the baby will be 2-3 inches in size. Body changes during your first trimester Your body goes through many changes during pregnancy. The changes vary andgenerally return to normal after your baby is born. Physical changes You may gain or lose weight. Your breasts may begin to grow larger and become tender. The tissue that surrounds your nipples (areola) may become darker. Dark spots or blotches (chloasma or mask of pregnancy) may develop on your face. You may have changes in your hair. These can include thickening or thinning of your hair or changes in texture. Health changes You may feel nauseous, and you may vomit. You may have heartburn. You may develop headaches. You may develop constipation. Your gums may bleed and may be sensitive to brushing and flossing. Other changes You may tire easily. You may urinate more often. Your menstrual periods will stop. You may have a loss of appetite. You may develop cravings for certain kinds of food. You may have changes in your emotions from day to day. You may have more vivid and strange dreams. Follow these instructions at home: Medicines Follow your health care provider's instructions regarding medicine use. Specific medicines may be either safe or unsafe to take during pregnancy. Do not take any medicines unless told to by your health care provider. Take a prenatal vitamin that contains at least  600 micrograms (mcg) of folic acid. Eating and drinking Eat a healthy diet that includes fresh fruits and vegetables, whole grains, good sources of protein such as meat, eggs, or tofu, and low-fat dairy products. Avoid raw meat and unpasteurized juice, milk, and cheese. These carry germs that can harm you and your baby. If you feel nauseous or you vomit: Eat 4 or 5 small meals a day instead of 3 large meals. Try eating a few soda crackers. Drink liquids between meals instead of during meals. You may need to take these actions to prevent or treat constipation: Drink enough fluid to keep your urine pale yellow. Eat foods that are high in fiber, such as beans, whole grains, and fresh fruits and vegetables. Limit foods that are high in fat and processed sugars, such as fried or sweet foods. Activity Exercise only as directed by your health care provider. Most people can continue their usual exercise routine during pregnancy. Try to exercise for 30 minutes at least 5 days a week. Stop exercising if you develop pain or cramping in the lower abdomen or lower back. Avoid exercising if it is very hot or humid or if you are at high altitude. Avoid heavy lifting. If you choose to, you may have sex unless your health care provider tells you not to. Relieving pain and discomfort Wear a good support bra to relieve breast tenderness. Rest with your legs elevated if you have leg cramps or low back pain. If you develop bulging veins (varicose veins) in your legs: Wear support hose as told by your health care provider. Elevate   your feet for 15 minutes, 3-4 times a day. Limit salt in your diet. Safety Wear your seat belt at all times when driving or riding in a car. Talk with your health care provider if someone is verbally or physically abusive to you. Talk with your health care provider if you are feeling sad or have thoughts of hurting yourself. Lifestyle Do not use hot tubs, steam rooms, or  saunas. Do not douche. Do not use tampons or scented sanitary pads. Do not use herbal remedies, alcohol, illegal drugs, or medicines that are not approved by your health care provider. Chemicals in these products can harm your baby. Do not use any products that contain nicotine or tobacco, such as cigarettes, e-cigarettes, and chewing tobacco. If you need help quitting, ask your health care provider. Avoid cat litter boxes and soil used by cats. These carry germs that can cause birth defects in the baby and possibly loss of the unborn baby (fetus) by miscarriage or stillbirth. General instructions During routine prenatal visits in the first trimester, your health care provider will do a physical exam, perform necessary tests, and ask you how things are going. Keep all follow-up visits. This is important. Ask for help if you have counseling or nutritional needs during pregnancy. Your health care provider can offer advice or refer you to specialists for help with various needs. Schedule a dentist appointment. At home, brush your teeth with a soft toothbrush. Floss gently. Write down your questions. Take them to your prenatal visits. Where to find more information American Pregnancy Association: americanpregnancy.org American College of Obstetricians and Gynecologists: acog.org/en/Womens%20Health/Pregnancy Office on Women's Health: womenshealth.gov/pregnancy Contact a health care provider if you have: Dizziness. A fever. Mild pelvic cramps, pelvic pressure, or nagging pain in the abdominal area. Nausea, vomiting, or diarrhea that lasts for 24 hours or longer. A bad-smelling vaginal discharge. Pain when you urinate. Known exposure to a contagious illness, such as chickenpox, measles, Zika virus, HIV, or hepatitis. Get help right away if you have: Spotting or bleeding from your vagina. Severe abdominal cramping or pain. Shortness of breath or chest pain. Any kind of trauma, such as from a fall  or a car crash. New or increased pain, swelling, or redness in an arm or leg. Summary The first trimester of pregnancy starts on the first day of your last menstrual period until the end of week 12 (months 1 through 3). Eating 4 or 5 small meals a day rather than 3 large meals may help to relieve nausea and vomiting. Do not use any products that contain nicotine or tobacco, such as cigarettes, e-cigarettes, and chewing tobacco. If you need help quitting, ask your health care provider. Keep all follow-up visits. This is important. This information is not intended to replace advice given to you by your health care provider. Make sure you discuss any questions you have with your healthcare provider. Document Revised: 06/01/2019 Document Reviewed: 04/07/2019 Elsevier Patient Education  2022 Elsevier Inc.  Pregnancy and Vaccinations Vaccines can help to keep you healthy. There are some vaccines that should begiven before pregnancy and some that should be given during pregnancy. How does this affect me? If you are pregnant or thinking about getting pregnant, talk with your healthcare provider about what vaccines are right for you. How does this affect my baby? Usually, the benefits of receiving vaccines during pregnancy outweigh the risks of harm to you or your baby if: The risk of being exposed to a disease is high. Infection would pose   a risk to you or your unborn baby. The vaccine is not likely to cause harm. Vaccines can help protect your baby from some diseases until he or she is oldenough to get the vaccine. What can I do to lower my risk? When you receive the recommended vaccines, it helps to protect you from gettingcertain diseases and passing them on to your baby. Should I receive vaccines before pregnancy? If possible, make sure that your vaccines are up to date before you become pregnant. It is safe and important for you to receive weakened viral and weakened bacterial vaccines  (inactivated vaccines) as needed before you are pregnant. Live viral and live bacterial vaccines, such as the measles, mumps, and rubella (MMR) vaccine, should be given 1 month or more before pregnancy. Sometimes, women become pregnant within 1 month of receiving a live vaccine that is not usually recommended during pregnancy. The U.S. Centers for Disease Control and Prevention (CDC) has reported that when this has happened, vaccineshave not harmed pregnant women or their unborn babies. Should I receive vaccines during pregnancy?  It is safe and important for you to receive some inactivated vaccines as needed during pregnancy. Until your baby can receive vaccines, your baby will get some protection from diseases through the vaccines that you receive while you arepregnant. During your pregnancy, you should receive the following: Influenza vaccine (the flu shot). The flu shot may protect you and your baby (up to 6 months of age) from some complications associated with strains of influenza that are covered by the vaccine. Pregnant women can receive the flu shot at any time during pregnancy. Tetanus, diphtheria, and pertussis (Tdap) vaccine. The Tdap vaccine will help to prevent whooping cough (pertussis) in you and your baby. You should receive 1 dose of this vaccine during each pregnancy. It is recommended that pregnant women receive this vaccine between 27 and 36 weeks of pregnancy. Should I receive vaccines after pregnancy? It is safe and important for you to receive vaccines as needed after pregnancy. Some are safe to have if you are breastfeeding. Other vaccines may not be safe to have until after you have stopped breastfeeding. If you did not receive the Tdap vaccine during your pregnancy and have never received a Tdap vaccine, you should receive that vaccine right after you give birth to your baby (delivery). If you are not immune to measles, mumps, rubella, or chickenpox (varicella), you should  receive those vaccines within days after delivery. It is important to talk with your health care provider about what vaccines you may need after delivery. What if I am pregnant and I plan to travel internationally? If you are pregnant and you are planning to travel internationally, talk with your health care provider at least 4-6 weeks before your trip. Depending on the country you are planning to visit, you may need to take special precautions orget certain vaccines to prevent disease. Vaccines that may be recommended for pregnant international travelers include: Influenza (the flu shot). Tetanus and diphtheria (Td) or Tdap. Hepatitis B (HepB). Hepatitis A (HepA). Your health care provider can help you decide if you need vaccines and if thebenefits outweigh the risk of disease exposure. Follow these instructions at home: Take over-the-counter and prescription medicines as told by your health care provider. Keep all follow-up visits as told by your health care provider. This is important. Questions to ask your health care provider: What vaccines are safe during pregnancy? What are the risks of vaccines during pregnancy? What are the potential   side effects of vaccines during or after pregnancy? When should I get vaccines during pregnancy? Contact a health care provider if you: Believe you have had a reaction to a vaccine. Have concerns or questions about a vaccine. Become pregnant within 1 month after you have received a live vaccine. Summary Vaccines are the most effective way to prevent certain diseases. Many vaccines are safe to receive during pregnancy. Some vaccines are recommended during pregnancy to protect you and your baby from getting sick. If you are pregnant or planning to become pregnant, talk with your health care provider about what vaccines are right for you. This information is not intended to replace advice given to you by your health care provider. Make sure you discuss  any questions you have with your healthcare provider. Document Revised: 06/01/2019 Document Reviewed: 01/27/2018 Elsevier Patient Education  2022 Elsevier Inc.  

## 2020-07-04 NOTE — Addendum Note (Signed)
Addended by: Clement Husbands A on: 07/04/2020 05:04 PM   Modules accepted: Orders

## 2020-07-05 LAB — RPR+RH+ABO+RUB AB+AB SCR+CB...
Antibody Screen: NEGATIVE
HIV Screen 4th Generation wRfx: NONREACTIVE
Hematocrit: 37.7 % (ref 34.0–46.6)
Hemoglobin: 12.6 g/dL (ref 11.1–15.9)
Hepatitis B Surface Ag: NEGATIVE
MCH: 28.4 pg (ref 26.6–33.0)
MCHC: 33.4 g/dL (ref 31.5–35.7)
MCV: 85 fL (ref 79–97)
Platelets: 302 10*3/uL (ref 150–450)
RBC: 4.43 x10E6/uL (ref 3.77–5.28)
RDW: 12.5 % (ref 11.7–15.4)
RPR Ser Ql: NONREACTIVE
Rh Factor: NEGATIVE
Rubella Antibodies, IGG: 1.16 index (ref >0.99)
Varicella zoster IgG: 1773 index (ref >165)
WBC: 9.5 10*3/uL (ref 3.4–10.8)

## 2020-07-05 LAB — COMPREHENSIVE METABOLIC PANEL
ALT: 15 IU/L (ref 0–32)
AST: 13 IU/L (ref 0–40)
Albumin/Globulin Ratio: 1.7 (ref 1.2–2.2)
Albumin: 4.5 g/dL (ref 3.8–4.8)
Alkaline Phosphatase: 95 IU/L (ref 44–121)
BUN/Creatinine Ratio: 10 (ref 9–23)
BUN: 9 mg/dL (ref 6–20)
Bilirubin Total: <0.2 mg/dL (ref 0.0–1.2)
CO2: 20 mmol/L (ref 20–29)
Calcium: 9.2 mg/dL (ref 8.7–10.2)
Chloride: 100 mmol/L (ref 96–106)
Creatinine, Ser: 0.88 mg/dL (ref 0.57–1.00)
Globulin, Total: 2.6 g/dL (ref 1.5–4.5)
Glucose: 105 mg/dL — ABNORMAL HIGH (ref 65–99)
Potassium: 4 mmol/L (ref 3.5–5.2)
Sodium: 136 mmol/L (ref 134–144)
Total Protein: 7.1 g/dL (ref 6.0–8.5)
eGFR: 89 mL/min/{1.73_m2} (ref >59)

## 2020-07-05 LAB — MONITOR DRUG PROFILE 10(MW)
Amphetamine Scrn, Ur: NEGATIVE ng/mL
BARBITURATE SCREEN URINE: NEGATIVE ng/mL
BENZODIAZEPINE SCREEN, URINE: NEGATIVE ng/mL
CANNABINOIDS UR QL SCN: NEGATIVE ng/mL
Cocaine (Metab) Scrn, Ur: NEGATIVE ng/mL
Creatinine(Crt), U: 61.9 mg/dL (ref 20.0–300.0)
Methadone Screen, Urine: NEGATIVE ng/mL
OXYCODONE+OXYMORPHONE UR QL SCN: NEGATIVE ng/mL
Opiate Scrn, Ur: NEGATIVE ng/mL
Ph of Urine: 5.4 (ref 4.5–8.9)
Phencyclidine Qn, Ur: NEGATIVE ng/mL
Propoxyphene Scrn, Ur: NEGATIVE ng/mL

## 2020-07-05 LAB — HEMOGLOBIN A1C
Est. average glucose Bld gHb Est-mCnc: 105 mg/dL
Hgb A1c MFr Bld: 5.3 % (ref 4.8–5.6)

## 2020-07-05 LAB — HEPATITIS C ANTIBODY: Hep C Virus Ab: <0.1 s/co ratio (ref 0.0–0.9)

## 2020-07-06 ENCOUNTER — Other Ambulatory Visit: Payer: Self-pay

## 2020-07-06 ENCOUNTER — Telehealth: Payer: Self-pay

## 2020-07-06 ENCOUNTER — Ambulatory Visit
Admission: RE | Admit: 2020-07-06 | Discharge: 2020-07-06 | Disposition: A | Discharge status: Home / Self Care | Payer: Medicaid Other | Source: Ambulatory Visit | Attending: Family Medicine | Admitting: Family Medicine

## 2020-07-06 VITALS — BP 131/90 | HR 98 | Temp 97.9°F | Resp 18

## 2020-07-06 DIAGNOSIS — R030 Elevated blood-pressure reading, without diagnosis of hypertension: Secondary | ICD-10-CM | POA: Diagnosis not present

## 2020-07-06 DIAGNOSIS — Z3A08 8 weeks gestation of pregnancy: Secondary | ICD-10-CM | POA: Diagnosis not present

## 2020-07-06 DIAGNOSIS — J069 Acute upper respiratory infection, unspecified: Secondary | ICD-10-CM

## 2020-07-06 LAB — CERVICOVAGINAL ANCILLARY ONLY
Chlamydia: NEGATIVE
Comment: NEGATIVE
Comment: NEGATIVE
Comment: NORMAL
Neisseria Gonorrhea: NEGATIVE
Trichomonas: NEGATIVE

## 2020-07-06 LAB — POCT RAPID STREP A (OFFICE): Rapid Strep A Screen: NEGATIVE

## 2020-07-06 LAB — URINE CULTURE

## 2020-07-06 NOTE — ED Provider Notes (Signed)
[name]    CSN: [id number] Arrival date & time: 07/06/20  1231      History   Chief Complaint Chief Complaint  Patient presents with   Cough    12:45 Appt    Sore Throat    HPI Courtney Johnston is a 30 y.o. female.  Patient is [redacted] weeks pregnant; seen by OB/GYN on 07/04/2020.  She presents today with sore throat, postnasal drip, congestion, cough x 3-4 days.  No treatment attempted at home.  She denies fever, chills, shortness of breath, vomiting, diarrhea, abdominal pain, or other symptoms.  Negative at-home COVID test x 2.  Her medical history includes congenital single kidney, CKD, elevated blood pressure reading, anemia.  The history is provided by the patient and medical records.   Past Medical History:  Diagnosis Date   Anemia    History of chicken pox    30yrs old    Patient Active Problem List   Diagnosis Date Noted   Supervision of other normal pregnancy, antepartum 07/04/2020   Ear pain, left 09/09/2017   Elevated blood pressure reading 09/09/2017   Dysuria 07/11/2017   Right flank pain 10/11/2015   CKD (chronic kidney disease) stage 1, GFR 90 ml/min or greater 10/26/2013   Routine general medical examination at a health care facility 09/15/2013   Congenital single kidney 09/15/2013    Past Surgical History:  Procedure Laterality Date   KIDNEY SURGERY Right 1990   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 Authorizing Provider  Prenatal Vit-Fe Fumarate-FA (PRENATAL MULTIVITAMIN) TABS tablet Take 1 tablet by mouth daily at 12 noon.   Yes [provider]  norgestimate-ethinyl estradiol (SPRINTEC 28) 0.25-35  MG-MCG tablet Take 1 tablet by mouth daily. Patient not taking: No sig reported 09/29/19 11/22/20  Tresea Mall, CNM    Family History Family History  Problem Relation Age of Onset   Arthritis Mother    Heart disease Mother    Hypertension Mother    Asthma Father    Early death Father        Drowning   Hypertension Maternal Aunt    Heart disease Maternal Uncle    Hypertension Maternal Uncle    Heart disease Maternal Grandmother    Hypertension Maternal Grandmother    Arthritis Maternal Grandfather    Hypertension Maternal Grandfather    Diabetes Paternal Grandfather     Social History Social History   Tobacco Use   Smoking status: Never   Smokeless tobacco: Never  Vaping Use   Vaping Use: Never used  Substance Use Topics   Alcohol use: Not Currently    Comment: occasionally.    Drug use: No     Allergies   Patient has no known allergies.   Review of Systems Review of Systems  Constitutional:  Negative for chills and fever.  HENT:  Positive for congestion,  postnasal drip and sore throat. Negative for ear pain.   Respiratory:  Positive for cough. Negative for shortness of breath.   Cardiovascular:  Negative for chest pain and palpitations.  Gastrointestinal:  Negative for abdominal pain, diarrhea and vomiting.  Skin:  Negative for color change and rash.  All other systems reviewed and are negative.   Physical Exam Triage Vital Signs ED Triage Vitals  Enc Vitals Group     BP      Pulse      Resp      Temp      Temp src      SpO2      Weight      Height      Head Circumference      Peak Flow      Pain Score      Pain Loc      Pain Edu?      Excl. in GC?    No data found.  Updated Vital Signs BP 131/90 (BP Location: Left Arm)   Pulse 98   Temp 97.9 F (36.6 C) (Oral)   Resp 18   LMP 05/10/2020   SpO2 97%   Visual Acuity Right Eye Distance:   Left Eye Distance:   Bilateral Distance:    Right Eye Near:   Left Eye Near:    Bilateral  Near:     Physical Exam Vitals and nursing note reviewed.  Constitutional:      General: She is not in acute distress.    Appearance: She is well-developed. She is not ill-appearing.  HENT:     Head: Normocephalic and atraumatic.     Right Ear: Tympanic membrane normal.     Left Ear: Tympanic membrane normal.     Nose: Nose normal.     Mouth/Throat:     Mouth: Mucous membranes are moist.     Pharynx: Posterior oropharyngeal erythema present.  Eyes:     Conjunctiva/sclera: Conjunctivae normal.  Cardiovascular:     Rate and Rhythm: Normal rate and regular rhythm.     Heart sounds: Normal heart sounds.  Pulmonary:     Effort: Pulmonary effort is normal. No respiratory distress.     Breath sounds: Normal breath sounds.  Abdominal:     Palpations: Abdomen is soft.     Tenderness: There is no abdominal tenderness.  Musculoskeletal:     Cervical back: Neck supple.  Skin:    General: Skin is warm and dry.     Findings: No rash.  Neurological:     General: No focal deficit present.     Mental Status: She is alert and oriented to person, place, and time.     Gait: Gait normal.  Psychiatric:        Mood and Affect: Mood normal.        Behavior: Behavior normal.     UC Treatments / Results  Labs (all labs ordered are listed, but only abnormal results are displayed) Labs Reviewed  CULTURE, GROUP A STREP ([facility])  NOVEL CORONAVIRUS, NAA  POCT RAPID STREP A (OFFICE)    EKG   Radiology No results found.  Procedures Procedures (including critical care time)  Medications Ordered in UC Medications - No data to display  Initial Impression / Assessment and Plan / UC Course  I have reviewed the triage vital signs and the nursing notes.  Pertinent labs & imaging results that were available during my care of the patient were reviewed by me and  considered in my medical decision making (see chart for details).   Viral URI, Elevated blood pressure reading, [redacted] weeks pregnant.   Rapid strep negative; culture pending.  COVID pending.  Instructed patient to self quarantine per CDC guidelines.  Discussed symptomatic treatment including Tylenol rest, hydration.  Instructed patient to follow up with PCP or OB if symptoms are not improving.  Discussed with patient that her blood pressure is elevated today and needs to be rechecked by her OB/GYN;  Instructed her to call her OB/GYN today to notify them of her blood pressure reading today.  Patient agrees to plan of care.    Final Clinical Impressions(s) / UC Diagnoses   Final diagnoses:  Viral URI  Elevated blood pressure reading  [redacted] weeks gestation of pregnancy     Discharge Instructions      Take Tylenol as needed for fever or discomfort.  Do not take any over-the-counter medications unless directed by your OB/GYN.  Your blood pressure is elevated today at 163/104; repeat 131/90.  Please have this rechecked by your OB/GYN in 1 week.          ED Prescriptions   None    PDMP not reviewed this encounter.   Mickie Bail, NP 07/06/20 1352

## 2020-07-06 NOTE — Telephone Encounter (Signed)
Pt calling; has a very very sore throat; neg for strep; strep cx sent; covid pending; was advised to quarantine by UC; her BP at first was 163/104; repeat was 130/90.  Was adv to let us know; f/u with [place] in one wk for BP ck.  [phone]

## 2020-07-06 NOTE — ED Triage Notes (Signed)
Pt presents with sore throat, nasal drainage, cough productive for thick green mucous.  Some small streaks of blood when blowing nose.  Throat is dry and sore primarily in mornings.  Denies fevers, body aches, trouble breathing.  Home COVID test x 2 was negative.   Pt is approx [redacted] wks pregnant.

## 2020-07-06 NOTE — Discharge Instructions (Addendum)
Take Tylenol as needed for fever or discomfort.  Do not take any over-the-counter medications unless directed by your OB/GYN.  Your blood pressure is elevated today at 163/104; repeat 131/90.  Please have this rechecked by your OB/GYN in 1 week.

## 2020-07-07 LAB — NOVEL CORONAVIRUS, NAA: SARS-CoV-2, NAA: NOT DETECTED

## 2020-07-07 LAB — SARS-COV-2, NAA 2 DAY TAT

## 2020-07-09 LAB — CULTURE, GROUP A STREP (THRC)

## 2020-07-10 NOTE — Telephone Encounter (Signed)
Patient is scheduled for 07/12/20 with JEG at 4:30

## 2020-07-12 ENCOUNTER — Other Ambulatory Visit: Payer: Self-pay

## 2020-07-12 ENCOUNTER — Encounter: Payer: Self-pay | Admitting: Advanced Practice Midwife

## 2020-07-12 ENCOUNTER — Ambulatory Visit (INDEPENDENT_AMBULATORY_CARE_PROVIDER_SITE_OTHER): Payer: Medicaid Other | Admitting: Advanced Practice Midwife

## 2020-07-12 VITALS — BP 132/92 | Wt 219.0 lb

## 2020-07-12 DIAGNOSIS — Z3480 Encounter for supervision of other normal pregnancy, unspecified trimester: Secondary | ICD-10-CM

## 2020-07-12 DIAGNOSIS — Z0130 Encounter for examination of blood pressure without abnormal findings: Secondary | ICD-10-CM

## 2020-07-12 DIAGNOSIS — Z3A09 9 weeks gestation of pregnancy: Secondary | ICD-10-CM

## 2020-07-12 NOTE — Progress Notes (Signed)
Routine Prenatal Care Visit  Subjective  Courtney Johnston is a 30 y.o. G2P1001 at [redacted]w[redacted]d being seen today for ongoing prenatal care.  She is currently monitored for the following issues for this low-risk pregnancy and has Routine general medical examination at a health care facility; Congenital single kidney; Right flank pain; CKD (chronic kidney disease) stage 1, GFR 90 ml/min or greater; Dysuria; Ear pain, left; Elevated blood pressure reading; and Supervision of other normal pregnancy, antepartum on their problem list.  ----------------------------------------------------------------------------------- Patient reports feeling a lot better from her recent illness. She still has some congestion. Safe medications reviewed. She is not currently taking any medications. She denies CHTN. Discussed possiblity of GHTN, too early for preeclampsia. She denies headache. Advised continue recovery from illness, return for scheduled visit, increase hydration, safe medications/supplements, go to ER for severe symptoms.    .  .   Pincus Large Fluid denies.  ----------------------------------------------------------------------------------- The following portions of the patient's history were reviewed and updated as appropriate: allergies, current medications, past family history, past medical history, past social history, past surgical history and problem list. Problem list updated.  Objective  Blood pressure (!) 132/92, weight 219 lb (99.3 kg), last menstrual period 05/10/2020. Pregravid weight Pregravid weight not on file Total Weight Gain Not found. Urinalysis: Urine Protein    Urine Glucose    Fetal Status:           General:  Alert, oriented and cooperative. Patient is in no acute distress.  Skin: Skin is warm and dry. No rash noted.   Cardiovascular: Normal heart rate noted  Respiratory: Normal respiratory effort, no problems with respiration noted  Abdomen: Soft, gravid, appropriate for  gestational age.       Pelvic:  Cervical exam deferred        Extremities: Normal range of motion.     Mental Status: Normal mood and affect. Normal behavior. Normal judgment and thought content.   Assessment   30 y.o. G2P1001 at [redacted]w[redacted]d by  02/14/2021, by Last Menstrual Period presenting for work-in prenatal visit  Plan   pregnancy2 Problems (from 07/04/20 to present)    Problem Noted Resolved   Supervision of other normal pregnancy, antepartum 07/04/2020 by Natale Milch, MD No   Overview Signed 07/04/2020  3:34 PM by Natale Milch, MD     Nursing Staff Provider  Office Location  [facility] Dating   lMP= 8 wk [place]  Language  English Anatomy US    Flu Vaccine   Genetic Screen  NIPS:   TDaP vaccine    Hgb A1C or  GTT Early : Third trimester :   Covid    LAB RESULTS   Rhogam   Blood Type     Feeding Plan  Antibody    Contraception  Rubella    Circumcision  RPR     Pediatrician   HBsAg     Support Person  HIV    Prenatal Classes  Varicella     GBS  (For PCN allergy, check sensitivities)   BTL Consent     VBAC Consent  Pap  2021 NIL    Hgb Electro      CF      SMA         History of one kidney            Preterm labor symptoms and general obstetric precautions including but not limited to vaginal bleeding, contractions, leaking of fluid and fetal movement were reviewed in detail with  the patient.   Return for scheduled prenatal visit.  Tresea Mall, CNM 07/12/2020 4:49 PM

## 2020-07-12 NOTE — Progress Notes (Signed)
BP check -  States around lunch today  bp was 123/75 (at home), has had readings of 140/81. RM 5

## 2020-07-25 ENCOUNTER — Encounter: Payer: Medicaid Other | Admitting: Advanced Practice Midwife

## 2020-08-08 ENCOUNTER — Encounter: Payer: Self-pay | Admitting: Obstetrics and Gynecology

## 2020-08-08 ENCOUNTER — Other Ambulatory Visit: Payer: Self-pay

## 2020-08-08 ENCOUNTER — Ambulatory Visit (INDEPENDENT_AMBULATORY_CARE_PROVIDER_SITE_OTHER): Payer: Medicaid Other | Admitting: Obstetrics and Gynecology

## 2020-08-08 VITALS — BP 128/74 | Ht 67.0 in | Wt 221.6 lb

## 2020-08-08 DIAGNOSIS — Z1379 Encounter for other screening for genetic and chromosomal anomalies: Secondary | ICD-10-CM

## 2020-08-08 DIAGNOSIS — Z3481 Encounter for supervision of other normal pregnancy, first trimester: Secondary | ICD-10-CM

## 2020-08-08 DIAGNOSIS — Z3A12 12 weeks gestation of pregnancy: Secondary | ICD-10-CM

## 2020-08-08 LAB — POCT URINALYSIS DIPSTICK OB
Glucose, UA: NEGATIVE
POC,PROTEIN,UA: NEGATIVE

## 2020-08-08 NOTE — Patient Instructions (Addendum)
Please start a daily 81 mg Aspirin to reduce your risk of preeclampsia First Trimester of Pregnancy  The first trimester of pregnancy starts on the first day of your last menstrual period until the end of week 12. This is months 1 through 3 of pregnancy. A week after a sperm fertilizes an egg, the egg will implant into the wall of the uterus and begin to develop into a baby. By the end of 12 weeks, all the baby'sorgans will be formed and the baby will be 2-3 inches in size. Body changes during your first trimester Your body goes through many changes during pregnancy. The changes vary andgenerally return to normal after your baby is born. Physical changes You may gain or lose weight. Your breasts may begin to grow larger and become tender. The tissue that surrounds your nipples (areola) may become darker. Dark spots or blotches (chloasma or mask of pregnancy) may develop on your face. You may have changes in your hair. These can include thickening or thinning of your hair or changes in texture. Health changes You may feel nauseous, and you may vomit. You may have heartburn. You may develop headaches. You may develop constipation. Your gums may bleed and may be sensitive to brushing and flossing. Other changes You may tire easily. You may urinate more often. Your menstrual periods will stop. You may have a loss of appetite. You may develop cravings for certain kinds of food. You may have changes in your emotions from day to day. You may have more vivid and strange dreams. Follow these instructions at home: Medicines Follow your health care provider's instructions regarding medicine use. Specific medicines may be either safe or unsafe to take during pregnancy. Do not take any medicines unless told to by your health care provider. Take a prenatal vitamin that contains at least 600 micrograms (mcg) of folic acid. Eating and drinking Eat a healthy diet that includes fresh fruits and  vegetables, whole grains, good sources of protein such as meat, eggs, or tofu, and low-fat dairy products. Avoid raw meat and unpasteurized juice, milk, and cheese. These carry germs that can harm you and your baby. If you feel nauseous or you vomit: Eat 4 or 5 small meals a day instead of 3 large meals. Try eating a few soda crackers. Drink liquids between meals instead of during meals. You may need to take these actions to prevent or treat constipation: Drink enough fluid to keep your urine pale yellow. Eat foods that are high in fiber, such as beans, whole grains, and fresh fruits and vegetables. Limit foods that are high in fat and processed sugars, such as fried or sweet foods. Activity Exercise only as directed by your health care provider. Most people can continue their usual exercise routine during pregnancy. Try to exercise for 30 minutes at least 5 days a week. Stop exercising if you develop pain or cramping in the lower abdomen or lower back. Avoid exercising if it is very hot or humid or if you are at high altitude. Avoid heavy lifting. If you choose to, you may have sex unless your health care provider tells you not to. Relieving pain and discomfort Wear a good support bra to relieve breast tenderness. Rest with your legs elevated if you have leg cramps or low back pain. If you develop bulging veins (varicose veins) in your legs: Wear support hose as told by your health care provider. Elevate your feet for 15 minutes, 3-4 times a day. Limit salt in  your diet. Safety Wear your seat belt at all times when driving or riding in a car. Talk with your health care provider if someone is verbally or physically abusive to you. Talk with your health care provider if you are feeling sad or have thoughts of hurting yourself. Lifestyle Do not use hot tubs, steam rooms, or saunas. Do not douche. Do not use tampons or scented sanitary pads. Do not use herbal remedies, alcohol, illegal  drugs, or medicines that are not approved by your health care provider. Chemicals in these products can harm your baby. Do not use any products that contain nicotine or tobacco, such as cigarettes, e-cigarettes, and chewing tobacco. If you need help quitting, ask your health care provider. Avoid cat litter boxes and soil used by cats. These carry germs that can cause birth defects in the baby and possibly loss of the unborn baby (fetus) by miscarriage or stillbirth. General instructions During routine prenatal visits in the first trimester, your health care provider will do a physical exam, perform necessary tests, and ask you how things are going. Keep all follow-up visits. This is important. Ask for help if you have counseling or nutritional needs during pregnancy. Your health care provider can offer advice or refer you to specialists for help with various needs. Schedule a dentist appointment. At home, brush your teeth with a soft toothbrush. Floss gently. Write down your questions. Take them to your prenatal visits. Where to find more information American Pregnancy Association: americanpregnancy.org [employer]necologists: https://www.todd-brady.net/ Office on [employer] Health: MightyReward[employer].nz Contact a health care provider if you have: Dizziness. A fever. Mild pelvic cramps, pelvic pressure, or nagging pain in the abdominal area. Nausea, vomiting, or diarrhea that lasts for 24 hours or longer. A bad-smelling vaginal discharge. Pain when you urinate. Known exposure to a contagious illness, such as chickenpox, measles, Zika virus, HIV, or hepatitis. Get help right away if you have: Spotting or bleeding from your vagina. Severe abdominal cramping or pain. Shortness of breath or chest pain. Any kind of trauma, such as from a fall or a car crash. New or increased pain, swelling, or redness in an arm or leg. Summary The first trimester of  pregnancy starts on the first day of your last menstrual period until the end of week 12 (months 1 through 3). Eating 4 or 5 small meals a day rather than 3 large meals may help to relieve nausea and vomiting. Do not use any products that contain nicotine or tobacco, such as cigarettes, e-cigarettes, and chewing tobacco. If you need help quitting, ask your health care provider. Keep all follow-up visits. This is important. This information is not intended to replace advice given to you by your health care provider. Make sure you discuss any questions you have with your healthcare provider. Document Revised: 06/01/2019 Document Reviewed: 04/07/2019 Elsevier Patient Education  2022 [employer].

## 2020-08-08 NOTE — Progress Notes (Signed)
    Routine Prenatal Care Visit  Subjective  Courtney Johnston is a 30 y.o. G2P1001 at 110w6d being seen today for ongoing prenatal care.  She is currently monitored for the following issues for this low-risk pregnancy and has Routine general medical examination at a health care facility; Congenital single kidney; Right flank pain; CKD (chronic kidney disease) stage 1, GFR 90 ml/min or greater; Dysuria; Ear pain, left; Elevated blood pressure reading; and Supervision of other normal pregnancy, antepartum on their problem list.  ----------------------------------------------------------------------------------- Patient reports no complaints.   Contractions: Not present. Vag. Bleeding: None.  Movement: Absent. Denies leaking of fluid.  ----------------------------------------------------------------------------------- The following portions of the patient's history were reviewed and updated as appropriate: allergies, current medications, past family history, past medical history, past social history, past surgical history and problem list. Problem list updated.   Objective  Blood pressure 128/74, height 5\' 7"  (1.702 m), weight 221 lb 9.6 oz (100.5 kg), last menstrual period 05/10/2020. Pregravid weight Pregravid weight not on file Total Weight Gain Not found. Urinalysis:      Fetal Status: Fetal Heart Rate (bpm): 160   Movement: Absent     General:  Alert, oriented and cooperative. Patient is in no acute distress.  Skin: Skin is warm and dry. No rash noted.   Cardiovascular: Normal heart rate noted  Respiratory: Normal respiratory effort, no problems with respiration noted  Abdomen: Soft, gravid, appropriate for gestational age. Pain/Pressure: Absent     Pelvic:  Cervical exam deferred        Extremities: Normal range of motion.     Mental Status: Normal mood and affect. Normal behavior. Normal judgment and thought content.     Assessment   30 y.o. G2P1001 at [redacted]w[redacted]d by  02/14/2021,  by Last Menstrual Period presenting for routine prenatal visit  Plan   pregnancy2 Problems (from 07/04/20 to present)     Problem Noted Resolved   Supervision of other normal pregnancy, antepartum 07/04/2020 by 07/06/2020, MD No   Overview Addendum 08/08/2020 10:02 AM by 10/08/2020, MD     Nursing Staff Provider  Office Location  [facility] Dating   lMP= 8 wk Natale Milch  Language  English Anatomy US    Flu Vaccine   Genetic Screen  NIPS:   TDaP vaccine    Hgb A1C or  GTT Early : Third trimester :   Covid    LAB RESULTS   Rhogam   Blood Type O/Negative/-- (06/29 1544)   Feeding Plan  Antibody Negative (06/29 1544)  Contraception  Rubella 1.16 (06/29 1544)  Circumcision  RPR Non Reactive (06/29 1544)   Pediatrician   HBsAg Negative (06/29 1544)   Support Person  HIV Non Reactive (06/29 1544)  Prenatal Classes  Varicella  immune    GBS  (For PCN allergy, check sensitivities)   BTL Consent     VBAC Consent  Pap  2021 NIL    Hgb Electro      CF      SMA        History of one kidney            Gestational age appropriate obstetric precautions including but not limited to vaginal bleeding, contractions, leaking of fluid and fetal movement were reviewed in detail with the patient.    Return in about 4 weeks (around 09/05/2020) for ROB in person.  09/07/2020 MD [facility], [facility] 08/08/2020, 10:04 AM

## 2020-08-12 LAB — MATERNIT21 PLUS CORE+SCA
Fetal Fraction: 6
Monosomy X (Turner Syndrome): NOT DETECTED
Result (T21): NEGATIVE
Trisomy 13 (Patau syndrome): NEGATIVE
Trisomy 18 (Edwards syndrome): NEGATIVE
Trisomy 21 (Down syndrome): NEGATIVE
XXX (Triple X Syndrome): NOT DETECTED
XXY (Klinefelter Syndrome): NOT DETECTED
XYY (Jacobs Syndrome): NOT DETECTED

## 2020-09-07 ENCOUNTER — Ambulatory Visit (INDEPENDENT_AMBULATORY_CARE_PROVIDER_SITE_OTHER): Payer: Medicaid Other | Admitting: Advanced Practice Midwife

## 2020-09-07 ENCOUNTER — Encounter: Payer: Self-pay | Admitting: Advanced Practice Midwife

## 2020-09-07 ENCOUNTER — Other Ambulatory Visit: Payer: Self-pay

## 2020-09-07 ENCOUNTER — Encounter: Payer: Medicaid Other | Admitting: Advanced Practice Midwife

## 2020-09-07 VITALS — BP 110/80 | Wt 219.0 lb

## 2020-09-07 DIAGNOSIS — Z369 Encounter for antenatal screening, unspecified: Secondary | ICD-10-CM

## 2020-09-07 DIAGNOSIS — Z23 Encounter for immunization: Secondary | ICD-10-CM | POA: Diagnosis not present

## 2020-09-07 DIAGNOSIS — Z3A17 17 weeks gestation of pregnancy: Secondary | ICD-10-CM

## 2020-09-07 DIAGNOSIS — Z3482 Encounter for supervision of other normal pregnancy, second trimester: Secondary | ICD-10-CM

## 2020-09-07 LAB — POCT URINALYSIS DIPSTICK OB
Glucose, UA: NEGATIVE
POC,PROTEIN,UA: NEGATIVE

## 2020-09-07 NOTE — Addendum Note (Signed)
Addended by: Donnetta Hail on: 09/07/2020 02:54 PM   Modules accepted: Orders

## 2020-09-07 NOTE — Progress Notes (Signed)
  Routine Prenatal Care Visit  Subjective  Courtney Johnston is a 30 y.o. G2P1001 at [redacted]w[redacted]d being seen today for ongoing prenatal care.  She is currently monitored for the following issues for this low-risk pregnancy and has Congenital single kidney; CKD (chronic kidney disease) stage 1, GFR 90 ml/min or greater; and Supervision of other normal pregnancy, antepartum on their problem list.  ----------------------------------------------------------------------------------- Patient reports no complaints.   Contractions: Not present. Vag. Bleeding: None.  Movement: Absent. Leaking Fluid denies.  ----------------------------------------------------------------------------------- The following portions of the patient's history were reviewed and updated as appropriate: allergies, current medications, past family history, past medical history, past social history, past surgical history and problem list. Problem list updated.  Objective  Blood pressure 110/80, weight 219 lb (99.3 kg), last menstrual period 05/10/2020. Pregravid weight 205 lb (93 kg) Total Weight Gain 14 lb (6.35 kg) Urinalysis: Urine Protein    Urine Glucose    Fetal Status: Fetal Heart Rate (bpm): 156   Movement: Absent     General:  Alert, oriented and cooperative. Patient is in no acute distress.  Skin: Skin is warm and dry. No rash noted.   Cardiovascular: Normal heart rate noted  Respiratory: Normal respiratory effort, no problems with respiration noted  Abdomen: Soft, gravid, appropriate for gestational age. Pain/Pressure: Absent     Pelvic:  Cervical exam deferred        Extremities: Normal range of motion.  Edema: None  Mental Status: Normal mood and affect. Normal behavior. Normal judgment and thought content.   Assessment   30 y.o. G2P1001 at [redacted]w[redacted]d by  02/14/2021, by Last Menstrual Period presenting for routine prenatal visit  Plan   pregnancy2 Problems (from 07/04/20 to present)    Problem Noted Resolved    Supervision of other normal pregnancy, antepartum 07/04/2020 by Natale Milch, MD No   Overview Addendum 08/08/2020 10:02 AM by Natale Milch, MD     Nursing Staff Provider  Office Location  [facility] Dating   lMP= 8 wk [place]  Language  English Anatomy US    Flu Vaccine   Genetic Screen  NIPS:   TDaP vaccine    Hgb A1C or  GTT Early : Third trimester :   Covid    LAB RESULTS   Rhogam   Blood Type O/Negative/-- (06/29 1544)   Feeding Plan  Antibody Negative (06/29 1544)  Contraception  Rubella 1.16 (06/29 1544)  Circumcision  RPR Non Reactive (06/29 1544)   Pediatrician   HBsAg Negative (06/29 1544)   Support Person  HIV Non Reactive (06/29 1544)  Prenatal Classes  Varicella  immune    GBS  (For PCN allergy, check sensitivities)   BTL Consent     VBAC Consent  Pap  2021 NIL    Hgb Electro      CF      SMA         History of one kidney           Preterm labor symptoms and general obstetric precautions including but not limited to vaginal bleeding, contractions, leaking of fluid and fetal movement were reviewed in detail with the patient. Please refer to After Visit Summary for other counseling recommendations.   Return in about 3 weeks (around 09/28/2020) for anatomy scan and rob after.  Tresea Mall, CNM 09/07/2020 2:50 PM

## 2020-09-07 NOTE — Progress Notes (Signed)
ROB- flu shot today, no concerns 

## 2020-09-28 ENCOUNTER — Other Ambulatory Visit: Payer: Self-pay

## 2020-09-28 ENCOUNTER — Ambulatory Visit
Admission: RE | Admit: 2020-09-28 | Discharge: 2020-09-28 | Disposition: A | Discharge status: Home / Self Care | Payer: Medicaid Other | Source: Ambulatory Visit | Attending: Advanced Practice Midwife | Admitting: Advanced Practice Midwife

## 2020-09-28 DIAGNOSIS — Z3482 Encounter for supervision of other normal pregnancy, second trimester: Secondary | ICD-10-CM

## 2020-09-28 DIAGNOSIS — Z369 Encounter for antenatal screening, unspecified: Secondary | ICD-10-CM | POA: Insufficient documentation

## 2020-10-05 ENCOUNTER — Ambulatory Visit (INDEPENDENT_AMBULATORY_CARE_PROVIDER_SITE_OTHER): Payer: Medicaid Other | Admitting: Advanced Practice Midwife

## 2020-10-05 ENCOUNTER — Other Ambulatory Visit: Payer: Self-pay

## 2020-10-05 VITALS — BP 122/78 | Wt 222.0 lb

## 2020-10-05 DIAGNOSIS — Z3482 Encounter for supervision of other normal pregnancy, second trimester: Secondary | ICD-10-CM

## 2020-10-05 DIAGNOSIS — Z369 Encounter for antenatal screening, unspecified: Secondary | ICD-10-CM

## 2020-10-05 DIAGNOSIS — Z3A21 21 weeks gestation of pregnancy: Secondary | ICD-10-CM

## 2020-10-05 NOTE — Progress Notes (Signed)
ROB - no concerns. RM 4 °

## 2020-10-05 NOTE — Progress Notes (Signed)
  Routine Prenatal Care Visit  Subjective  Courtney Johnston is a 30 y.o. G2P1001 at [redacted]w[redacted]d being seen today for ongoing prenatal care.  She is currently monitored for the following issues for this low-risk pregnancy and has Congenital single kidney; CKD (chronic kidney disease) stage 1, GFR 90 ml/min or greater; and Supervision of other normal pregnancy, antepartum on their problem list.  ----------------------------------------------------------------------------------- Patient reports no complaints.   Contractions: Not present. Vag. Bleeding: None.  Movement: Present. Leaking Fluid denies.  ----------------------------------------------------------------------------------- The following portions of the patient's history were reviewed and updated as appropriate: allergies, current medications, past family history, past medical history, past social history, past surgical history and problem list. Problem list updated.  Objective  Blood pressure 122/78, weight 222 lb (100.7 kg), last menstrual period 05/10/2020. Pregravid weight 205 lb (93 kg) Total Weight Gain 17 lb (7.711 kg) Urinalysis: Urine Protein    Urine Glucose    Fetal Status: Fetal Heart Rate (bpm): 152 Fundal Height: 21 cm Movement: Present      Anatomy scan: incomplete for spine and otherwise complete, normal, female, placenta anterior  General:  Alert, oriented and cooperative. Patient is in no acute distress.  Skin: Skin is warm and dry. No rash noted.   Cardiovascular: Normal heart rate noted  Respiratory: Normal respiratory effort, no problems with respiration noted  Abdomen: Soft, gravid, appropriate for gestational age. Pain/Pressure: Absent     Pelvic:  Cervical exam deferred        Extremities: Normal range of motion.  Edema: None  Mental Status: Normal mood and affect. Normal behavior. Normal judgment and thought content.   Assessment   30 y.o. G2P1001 at [redacted]w[redacted]d by  02/14/2021, by Last Menstrual Period  presenting for routine prenatal visit  Plan   pregnancy2 Problems (from 07/04/20 to present)    Problem Noted Resolved   Supervision of other normal pregnancy, antepartum 07/04/2020 by Natale Milch, MD No   Overview Addendum 08/08/2020 10:02 AM by Natale Milch, MD     Nursing Staff Provider  Office Location  [facility] Dating   lMP= 8 wk [place]  Language  English Anatomy US    Flu Vaccine   Genetic Screen  NIPS:   TDaP vaccine    Hgb A1C or  GTT Early : Third trimester :   Covid    LAB RESULTS   Rhogam   Blood Type O/Negative/-- (06/29 1544)   Feeding Plan  Antibody Negative (06/29 1544)  Contraception  Rubella 1.16 (06/29 1544)  Circumcision  RPR Non Reactive (06/29 1544)   Pediatrician   HBsAg Negative (06/29 1544)   Support Person  HIV Non Reactive (06/29 1544)  Prenatal Classes  Varicella  immune    GBS  (For PCN allergy, check sensitivities)   BTL Consent     VBAC Consent  Pap  2021 NIL    Hgb Electro      CF      SMA         History of one kidney           Preterm labor symptoms and general obstetric precautions including but not limited to vaginal bleeding, contractions, leaking of fluid and fetal movement were reviewed in detail with the patient.   Return in about 4 weeks (around 11/02/2020) for f/u anatomy and rob after .  Tresea Mall, CNM 10/05/2020 9:32 AM

## 2020-10-22 ENCOUNTER — Observation Stay
Admission: EM | Admit: 2020-10-22 | Discharge: 2020-10-22 | Disposition: A | Discharge status: Home / Self Care | Payer: Medicaid Other | Attending: Advanced Practice Midwife | Admitting: Advanced Practice Midwife

## 2020-10-22 ENCOUNTER — Other Ambulatory Visit: Payer: Self-pay

## 2020-10-22 ENCOUNTER — Encounter: Payer: Self-pay | Admitting: Obstetrics and Gynecology

## 2020-10-22 DIAGNOSIS — Z3480 Encounter for supervision of other normal pregnancy, unspecified trimester: Secondary | ICD-10-CM

## 2020-10-22 DIAGNOSIS — O9A212 Injury, poisoning and certain other consequences of external causes complicating pregnancy, second trimester: Principal | ICD-10-CM | POA: Insufficient documentation

## 2020-10-22 DIAGNOSIS — W19XXXA Unspecified fall, initial encounter: Secondary | ICD-10-CM | POA: Diagnosis not present

## 2020-10-22 DIAGNOSIS — O09522 Supervision of elderly multigravida, second trimester: Secondary | ICD-10-CM | POA: Diagnosis not present

## 2020-10-22 DIAGNOSIS — Z3A23 23 weeks gestation of pregnancy: Secondary | ICD-10-CM | POA: Diagnosis not present

## 2020-10-22 NOTE — Discharge Summary (Signed)
Physician Final Progress Note  Patient ID: Courtney Johnston MRN: [id number] DOB/AGE: 08-25-1990 30 y.o.  Admit date: 10/22/2020 Admitting provider: Tresea Mall, CNM Discharge date: 10/22/2020   Admission Diagnoses:  1) intrauterine pregnancy at [redacted]w[redacted]d  2) fall at home getting out of tub  Discharge Diagnoses:  Principal Problem:   Supervision of other normal pregnancy, antepartum Active Problems:   Labor and delivery, indication for care   History of Present Illness: The patient is a 30 y.o. female G2P1001 at [redacted]w[redacted]d who presents for slipping while getting out of the tub this evening around 9 PM. She denies hitting her lower abdomen. She reports hitting her arm and upper abdomen. She denies any current pain, bleeding, leakage of fluid or contractions. She reports good fetal movement. She was admitted for observation and placed on monitors. Reassuring tracing for 23 week fetus. She is discharged to home with instructions and precautions.    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   Current Outpatient Medications on File Prior to Encounter  Medication Sig Dispense Refill   Prenatal Vit-Fe Fumarate-FA (PRENATAL MULTIVITAMIN) TABS tablet Take 1 tablet by mouth daily at 12 noon.      No Known Allergies  Social History   Socioeconomic History   Marital status: Single    Spouse name: Letitia Libra   Number of children: 0   Years of education: 12   Highest education level: Not on file  Occupational History   Occupation: Caregiver  Tobacco Use   Smoking status: Never   Smokeless tobacco: Never  Vaping Use   Vaping Use: Never used  Substance and Sexual Activity   Alcohol  use: Not Currently    Comment: occasionally.    Drug use: No   Sexual activity: Yes    Birth control/protection: Pill  Other Topics Concern   Not on file  Social History Narrative   Hobbies: Reading. She enjoys spending time with family   Caffeine: 1 cup of coffee daily   Exercise: once a week. Walking on treadmill   Social Determinants of Health   Financial Resource Strain: Not on file  Food Insecurity: Not on file  Transportation Needs: Not on file  Physical Activity: Not on file  Stress: Not on file  Social Connections: Not on file  Intimate Partner Violence: Not on file    Family History  Problem Relation Age of Onset   Arthritis Mother    Heart disease Mother    Hypertension Mother    Asthma Father    Early death Father        Drowning   Hypertension Maternal Aunt    Heart disease Maternal Uncle    Hypertension Maternal Uncle    Heart disease Maternal Grandmother    Hypertension Maternal Grandmother    Arthritis Maternal Grandfather    Hypertension Maternal Grandfather    Diabetes Paternal Grandfather      Review of Systems  Constitutional:  Negative for chills and fever.  HENT:  Negative for congestion, ear discharge, ear pain, hearing loss, sinus pain and sore throat.   Eyes:  Negative for blurred vision and double vision.  Respiratory:  Negative  for cough, shortness of breath and wheezing.   Cardiovascular:  Negative for chest pain, palpitations and leg swelling.  Gastrointestinal:  Negative for abdominal pain, blood in stool, constipation, diarrhea, heartburn, melena, nausea and vomiting.  Genitourinary:  Negative for dysuria, flank pain, frequency, hematuria and urgency.  Musculoskeletal:  Negative for back pain, joint pain and myalgias.  Skin:  Negative for itching and rash.  Neurological:  Negative for dizziness, tingling, tremors, sensory change, speech change, focal weakness, seizures, loss of consciousness, weakness and headaches.   Endo/Heme/Allergies:  Negative for environmental allergies. Does not bruise/bleed easily.  Psychiatric/Behavioral:  Negative for depression, hallucinations, memory loss, substance abuse and suicidal ideas. The patient is not nervous/anxious and does not have insomnia.     Physical Exam: BP 134/69 (BP Location: Right Arm)   Pulse 90   Temp 97.6 F (36.4 C) (Oral)   Resp 16   Ht 5\' 7"  (1.702 m)   Wt 99.8 kg   LMP 05/10/2020   BMI 34.46 kg/m   Constitutional: Well nourished, well developed female in no acute distress.  HEENT: normal Skin: Warm and dry.  Cardiovascular: Regular rate and rhythm.   Extremity:  no edema   Respiratory: Clear to auscultation bilateral. Normal respiratory effort Abdomen: FHT present Back: no CVAT Neuro: DTRs 2+, Cranial nerves grossly intact Psych: Alert and Oriented x3. No memory deficits. Normal mood and affect.     Consults: None  Significant Findings/ Diagnostic Studies: none  Procedures: NST  Hospital Course: The patient was admitted to Labor and Delivery Triage for observation.   Discharge Condition: good  Disposition: Discharge disposition: 01-Home or Self Care  Diet: Regular diet  Discharge Activity: Activity as tolerated  Discharge Instructions     Discharge activity:  No Restrictions   Complete by: As directed    Discharge diet:  No restrictions   Complete by: As directed       Allergies as of 10/22/2020   No Known Allergies      Medication List     TAKE these medications    prenatal multivitamin Tabs tablet Take 1 tablet by mouth daily at 12 noon.        Follow-up Information     [facility]. Go to.   Specialty: Obstetrics and Gynecology Why: scheduled appointment Contact information: [address] [place] [phone]                Total time spent taking care of this patient: 15 minutes  Signed: [phone], CNM  10/22/2020, 10:12 PM

## 2020-10-22 NOTE — OB Triage Note (Signed)
Pt is a G2P1. Pt fell getting out of the tub around 9pm.pt reports hitting the top of her stomach. Pt reports no bleeding, no LOF, no N/V/D.

## 2020-10-31 ENCOUNTER — Other Ambulatory Visit: Payer: Self-pay

## 2020-10-31 ENCOUNTER — Ambulatory Visit
Admission: RE | Admit: 2020-10-31 | Discharge: 2020-10-31 | Disposition: A | Discharge status: Home / Self Care | Payer: Medicaid Other | Source: Ambulatory Visit | Attending: Advanced Practice Midwife | Admitting: Advanced Practice Midwife

## 2020-10-31 DIAGNOSIS — Z3482 Encounter for supervision of other normal pregnancy, second trimester: Secondary | ICD-10-CM | POA: Insufficient documentation

## 2020-10-31 DIAGNOSIS — Z369 Encounter for antenatal screening, unspecified: Secondary | ICD-10-CM | POA: Insufficient documentation

## 2020-11-02 ENCOUNTER — Encounter: Payer: Self-pay | Admitting: Advanced Practice Midwife

## 2020-11-02 ENCOUNTER — Other Ambulatory Visit: Payer: Self-pay

## 2020-11-02 ENCOUNTER — Ambulatory Visit (INDEPENDENT_AMBULATORY_CARE_PROVIDER_SITE_OTHER): Payer: Medicaid Other | Admitting: Advanced Practice Midwife

## 2020-11-02 VITALS — BP 126/70 | Wt 228.0 lb

## 2020-11-02 DIAGNOSIS — Z130 Encounter for screening for diseases of the blood and blood-forming organs and certain disorders involving the immune mechanism: Secondary | ICD-10-CM

## 2020-11-02 DIAGNOSIS — Z3482 Encounter for supervision of other normal pregnancy, second trimester: Secondary | ICD-10-CM

## 2020-11-02 DIAGNOSIS — Z113 Encounter for screening for infections with a predominantly sexual mode of transmission: Secondary | ICD-10-CM

## 2020-11-02 DIAGNOSIS — Z369 Encounter for antenatal screening, unspecified: Secondary | ICD-10-CM

## 2020-11-02 DIAGNOSIS — Z3A25 25 weeks gestation of pregnancy: Secondary | ICD-10-CM

## 2020-11-02 DIAGNOSIS — Z131 Encounter for screening for diabetes mellitus: Secondary | ICD-10-CM

## 2020-11-02 LAB — POCT URINALYSIS DIPSTICK OB
Glucose, UA: NEGATIVE
POC,PROTEIN,UA: NEGATIVE

## 2020-11-02 NOTE — Addendum Note (Signed)
Addended by: Donnetta Hail on: 11/02/2020 10:34 AM   Modules accepted: Orders

## 2020-11-02 NOTE — Progress Notes (Signed)
  Routine Prenatal Care Visit  Subjective  Courtney Johnston is a 30 y.o. G2P1001 at [redacted]w[redacted]d being seen today for ongoing prenatal care.  She is currently monitored for the following issues for this low-risk pregnancy and has Congenital single kidney; CKD (chronic kidney disease) stage 1, GFR 90 ml/min or greater; Supervision of other normal pregnancy, antepartum; and Labor and delivery, indication for care on their problem list.  ----------------------------------------------------------------------------------- Patient reports no complaints.   Contractions: Not present. Vag. Bleeding: None.  Movement: Present. Leaking Fluid denies.  ----------------------------------------------------------------------------------- The following portions of the patient's history were reviewed and updated as appropriate: allergies, current medications, past family history, past medical history, past social history, past surgical history and problem list. Problem list updated.  Objective  Blood pressure 126/70, weight 228 lb (103.4 kg), last menstrual period 05/10/2020. Pregravid weight 205 lb (93 kg) Total Weight Gain 23 lb (10.4 kg) Urinalysis: Urine Protein    Urine Glucose    Fetal Status: Fetal Heart Rate (bpm): 155 Fundal Height: 25 cm Movement: Present      Follow up anatomy scan 10/26: report is not available, it is now complete for spine as far as the patient knows  General:  Alert, oriented and cooperative. Patient is in no acute distress.  Skin: Skin is warm and dry. No rash noted.   Cardiovascular: Normal heart rate noted  Respiratory: Normal respiratory effort, no problems with respiration noted  Abdomen: Soft, gravid, appropriate for gestational age. Pain/Pressure: Absent     Pelvic:  Cervical exam deferred        Extremities: Normal range of motion.  Edema: None  Mental Status: Normal mood and affect. Normal behavior. Normal judgment and thought content.   Assessment   30 y.o.  G2P1001 at [redacted]w[redacted]d by  02/14/2021, by Last Menstrual Period presenting for routine prenatal visit  Plan   pregnancy2 Problems (from 07/04/20 to present)    Problem Noted Resolved   Supervision of other normal pregnancy, antepartum 07/04/2020 by Natale Milch, MD No   Overview Addendum 08/08/2020 10:02 AM by Natale Milch, MD     Nursing Staff Provider  Office Location  [facility] Dating   lMP= 8 wk [place]  Language  English Anatomy US    Flu Vaccine   Genetic Screen  NIPS:   TDaP vaccine    Hgb A1C or  GTT Early : Third trimester :   Covid    LAB RESULTS   Rhogam   Blood Type O/Negative/-- (06/29 1544)   Feeding Plan  Antibody Negative (06/29 1544)  Contraception  Rubella 1.16 (06/29 1544)  Circumcision  RPR Non Reactive (06/29 1544)   Pediatrician   HBsAg Negative (06/29 1544)   Support Person  HIV Non Reactive (06/29 1544)  Prenatal Classes  Varicella  immune    GBS  (For PCN allergy, check sensitivities)   BTL Consent     VBAC Consent  Pap  2021 NIL    Hgb Electro      CF      SMA         History of one kidney           Preterm labor symptoms and general obstetric precautions including but not limited to vaginal bleeding, contractions, leaking of fluid and fetal movement were reviewed in detail with the patient.   Return in about 3 weeks (around 11/23/2020) for 28 wk labs and rob.  Tresea Mall, CNM 11/02/2020 10:30 AM

## 2020-11-02 NOTE — Progress Notes (Signed)
ROB- no concerns 

## 2020-11-23 ENCOUNTER — Encounter: Payer: Medicaid Other | Admitting: Obstetrics & Gynecology

## 2020-11-23 ENCOUNTER — Other Ambulatory Visit: Payer: Self-pay

## 2020-11-23 ENCOUNTER — Other Ambulatory Visit: Payer: Medicaid Other

## 2020-11-23 DIAGNOSIS — Z130 Encounter for screening for diseases of the blood and blood-forming organs and certain disorders involving the immune mechanism: Secondary | ICD-10-CM

## 2020-11-23 DIAGNOSIS — Z369 Encounter for antenatal screening, unspecified: Secondary | ICD-10-CM

## 2020-11-23 DIAGNOSIS — Z131 Encounter for screening for diabetes mellitus: Secondary | ICD-10-CM

## 2020-11-23 DIAGNOSIS — Z3482 Encounter for supervision of other normal pregnancy, second trimester: Secondary | ICD-10-CM

## 2020-11-23 DIAGNOSIS — Z113 Encounter for screening for infections with a predominantly sexual mode of transmission: Secondary | ICD-10-CM

## 2020-11-24 LAB — 28 WEEKS RH-PANEL
Antibody Screen: NEGATIVE
Basophils Absolute: 0 10*3/uL (ref 0.0–0.2)
Basos: 0 %
EOS (ABSOLUTE): 0 10*3/uL (ref 0.0–0.4)
Eos: 0 %
Gestational Diabetes Screen: 132 mg/dL (ref 70–139)
HIV Screen 4th Generation wRfx: NONREACTIVE
Hematocrit: 31.2 % — ABNORMAL LOW (ref 34.0–46.6)
Hemoglobin: 10.6 g/dL — ABNORMAL LOW (ref 11.1–15.9)
Immature Grans (Abs): 0.1 10*3/uL (ref 0.0–0.1)
Immature Granulocytes: 1 %
Lymphocytes Absolute: 1.2 10*3/uL (ref 0.7–3.1)
Lymphs: 14 %
MCH: 29.9 pg (ref 26.6–33.0)
MCHC: 34 g/dL (ref 31.5–35.7)
MCV: 88 fL (ref 79–97)
Monocytes Absolute: 0.3 10*3/uL (ref 0.1–0.9)
Monocytes: 3 %
Neutrophils Absolute: 7.2 10*3/uL — ABNORMAL HIGH (ref 1.4–7.0)
Neutrophils: 82 %
Platelets: 246 10*3/uL (ref 150–450)
RBC: 3.54 x10E6/uL — ABNORMAL LOW (ref 3.77–5.28)
RDW: 13.5 % (ref 11.7–15.4)
RPR Ser Ql: NONREACTIVE
WBC: 8.8 10*3/uL (ref 3.4–10.8)

## 2020-12-05 ENCOUNTER — Ambulatory Visit (INDEPENDENT_AMBULATORY_CARE_PROVIDER_SITE_OTHER): Payer: Medicaid Other | Admitting: Obstetrics

## 2020-12-05 ENCOUNTER — Other Ambulatory Visit: Payer: Self-pay

## 2020-12-05 ENCOUNTER — Other Ambulatory Visit: Payer: Self-pay | Admitting: Obstetrics

## 2020-12-05 VITALS — BP 113/70 | Ht 66.0 in | Wt 238.4 lb

## 2020-12-05 DIAGNOSIS — Z3403 Encounter for supervision of normal first pregnancy, third trimester: Secondary | ICD-10-CM

## 2020-12-05 DIAGNOSIS — Q602 Renal agenesis, unspecified: Secondary | ICD-10-CM

## 2020-12-05 DIAGNOSIS — Z3A29 29 weeks gestation of pregnancy: Secondary | ICD-10-CM

## 2020-12-05 DIAGNOSIS — Z3480 Encounter for supervision of other normal pregnancy, unspecified trimester: Secondary | ICD-10-CM

## 2020-12-05 LAB — POCT URINALYSIS DIPSTICK OB
Glucose, UA: NEGATIVE
POC,PROTEIN,UA: NEGATIVE

## 2020-12-05 NOTE — Progress Notes (Signed)
Routine Prenatal Care Visit  Subjective  Courtney Johnston is a 30 y.o. G2P1001 at [redacted]w[redacted]d being seen today for ongoing prenatal care.  She is currently monitored for the following issues for this high-risk pregnancy and has Congenital single kidney; CKD (chronic kidney disease) stage 1, GFR 90 ml/min or greater; Supervision of other normal pregnancy, antepartum; and Labor and delivery, indication for care on their problem list.  ----------------------------------------------------------------------------------- Patient reports no complaints.   Contractions: Not present. Vag. Bleeding: None.  Movement: Present. Leaking Fluid denies.  ----------------------------------------------------------------------------------- The following portions of the patient's history were reviewed and updated as appropriate: allergies, current medications, past family history, past medical history, past social history, past surgical history and problem list. Problem list updated.  Objective  Blood pressure 113/70, height 5\' 6"  (1.676 m), weight 238 lb 6.4 oz (108.1 kg), last menstrual period 05/10/2020. Pregravid weight 205 lb (93 kg) Total Weight Gain 33 lb 6.4 oz (15.2 kg) Urinalysis: Urine Protein    Urine Glucose    Fetal Status:     Movement: Present     General:  Alert, oriented and cooperative. Patient is in no acute distress.  Skin: Skin is warm and dry. No rash noted.   Cardiovascular: Normal heart rate noted  Respiratory: Normal respiratory effort, no problems with respiration noted  Abdomen: Soft, gravid, appropriate for gestational age. Pain/Pressure: Absent     Pelvic:  Cervical exam deferred        Extremities: Normal range of motion.     Mental Status: Normal mood and affect. Normal behavior. Normal judgment and thought content.   Assessment   30 y.o. G2P1001 at [redacted]w[redacted]d by  02/14/2021, by Last Menstrual Period presenting for routine prenatal visit  Plan   pregnancy2 Problems (from  07/04/20 to present)    Problem Noted Resolved   Supervision of other normal pregnancy, antepartum 07/04/2020 by 07/06/2020, MD No   Overview Addendum 08/08/2020 10:02 AM by 10/08/2020, MD     Nursing Staff Provider  Office Location  [facility] Dating   lMP= 8 wk Natale Milch  Language  English Anatomy US    Flu Vaccine   Genetic Screen  NIPS:   TDaP vaccine    Hgb A1C or  GTT Early : Third trimester :   Covid    LAB RESULTS   Rhogam   Blood Type O/Negative/-- (06/29 1544)   Feeding Plan  Antibody Negative (06/29 1544)  Contraception  Rubella 1.16 (06/29 1544)  Circumcision  RPR Non Reactive (06/29 1544)   Pediatrician   HBsAg Negative (06/29 1544)   Support Person  HIV Non Reactive (06/29 1544)  Prenatal Classes  Varicella  immune    GBS  (For PCN allergy, check sensitivities)   BTL Consent     VBAC Consent  Pap  2021 NIL    Hgb Electro      CF      SMA         History of one kidney           Preterm labor symptoms and general obstetric precautions including but not limited to vaginal bleeding, contractions, leaking of fluid and fetal movement were reviewed in detail with the patient. Please refer to After Visit Summary for other counseling recommendations.  28 week labs drawn, plus Creatinine (One kidney) She measures larger for dates today. (BMI is also 38) Will order growth scan.  No follow-ups on file.  2022, CNM  12/05/2020 10:07 AM

## 2020-12-06 LAB — 28 WEEK RH+PANEL
Basophils Absolute: 0 10*3/uL (ref 0.0–0.2)
Basos: 0 %
EOS (ABSOLUTE): 0 10*3/uL (ref 0.0–0.4)
Eos: 0 %
Gestational Diabetes Screen: 133 mg/dL (ref 70–139)
HIV Screen 4th Generation wRfx: NONREACTIVE
Hematocrit: 28.9 % — ABNORMAL LOW (ref 34.0–46.6)
Hemoglobin: 10 g/dL — ABNORMAL LOW (ref 11.1–15.9)
Immature Grans (Abs): 0.1 10*3/uL (ref 0.0–0.1)
Immature Granulocytes: 1 %
Lymphocytes Absolute: 1.3 10*3/uL (ref 0.7–3.1)
Lymphs: 15 %
MCH: 29.9 pg (ref 26.6–33.0)
MCHC: 34.6 g/dL (ref 31.5–35.7)
MCV: 86 fL (ref 79–97)
Monocytes Absolute: 0.4 10*3/uL (ref 0.1–0.9)
Monocytes: 5 %
Neutrophils Absolute: 6.8 10*3/uL (ref 1.4–7.0)
Neutrophils: 79 %
Platelets: 240 10*3/uL (ref 150–450)
RBC: 3.35 x10E6/uL — ABNORMAL LOW (ref 3.77–5.28)
RDW: 13.3 % (ref 11.7–15.4)
RPR Ser Ql: NONREACTIVE
WBC: 8.7 10*3/uL (ref 3.4–10.8)

## 2020-12-06 LAB — CREATININE, SERUM
Creatinine, Ser: 0.66 mg/dL (ref 0.57–1.00)
eGFR: 119 mL/min/{1.73_m2} (ref >59)

## 2020-12-19 ENCOUNTER — Ambulatory Visit (INDEPENDENT_AMBULATORY_CARE_PROVIDER_SITE_OTHER): Payer: Medicaid Other | Admitting: Licensed Practical Nurse

## 2020-12-19 ENCOUNTER — Encounter: Payer: Self-pay | Admitting: Licensed Practical Nurse

## 2020-12-19 ENCOUNTER — Other Ambulatory Visit: Payer: Self-pay

## 2020-12-19 VITALS — BP 118/74 | Wt 234.0 lb

## 2020-12-19 DIAGNOSIS — Z3480 Encounter for supervision of other normal pregnancy, unspecified trimester: Secondary | ICD-10-CM

## 2020-12-19 DIAGNOSIS — Z2913 Encounter for prophylactic Rho(D) immune globulin: Secondary | ICD-10-CM | POA: Diagnosis not present

## 2020-12-19 DIAGNOSIS — Z3A31 31 weeks gestation of pregnancy: Secondary | ICD-10-CM

## 2020-12-19 MED ORDER — RHO D IMMUNE GLOBULIN 1500 UNIT/2ML IJ SOSY
300.0000 ug | PREFILLED_SYRINGE | Freq: Once | INTRAMUSCULAR | Status: AC
  Administered 2020-12-19: 09:00:00 300 ug via INTRAMUSCULAR

## 2020-12-19 NOTE — Progress Notes (Signed)
No vb. No lof.  

## 2020-12-19 NOTE — Progress Notes (Signed)
Routine Prenatal Care Visit  Subjective  Courtney Johnston is a 30 y.o. G2P1001 at [redacted]w[redacted]d being seen today for ongoing prenatal care.  She is currently monitored for the following issues for this low-risk pregnancy and has Congenital single kidney; CKD (chronic kidney disease) stage 1, GFR 90 ml/min or greater; Supervision of other normal pregnancy, antepartum; and Labor and delivery, indication for care on their problem list.  ----------------------------------------------------------------------------------- Patient reports heartburn using TUMS with good relief.  Was not contacted about scheduling a growth US-she did call and was told someone would call her.  Size=dates today, prefers to not have scan done.  Contractions: Not present. Vag. Bleeding: None.   . Leaking Fluid denies.  ----------------------------------------------------------------------------------- The following portions of the patient's history were reviewed and updated as appropriate: allergies, current medications, past family history, past medical history, past social history, past surgical history and problem list. Problem list updated.  Objective  Blood pressure 118/74, weight 234 lb (106.1 kg), last menstrual period 05/10/2020. Pregravid weight 205 lb (93 kg) Total Weight Gain 29 lb (13.2 kg) Urinalysis: Urine Protein    Urine Glucose    Fetal Status:         movement present   General:  Alert, oriented and cooperative. Patient is in no acute distress.  Skin: Skin is warm and dry. No rash noted.   Cardiovascular: Normal heart rate noted  Respiratory: Normal respiratory effort, no problems with respiration noted  Abdomen: Soft, gravid, appropriate for gestational age. Pain/Pressure: Absent     Pelvic:  Cervical exam deferred        Extremities: Normal range of motion.     Mental Status: Normal mood and affect. Normal behavior. Normal judgment and thought content.   Assessment   30 y.o. G2P1001 at [redacted]w[redacted]d by   02/14/2021, by Last Menstrual Period presenting for routine prenatal visit  Plan   pregnancy2 Problems (from 07/04/20 to present)    Problem Noted Resolved   Supervision of other normal pregnancy, antepartum 07/04/2020 by Natale Milch, MD No   Overview Addendum 12/19/2020  8:54 AM by Ellwood Sayers, CNM     Nursing Staff Provider  Office Location  [facility] Dating   lMP= 8 wk [place]  Language  English Anatomy US   normal  Flu Vaccine  09/07/20 Genetic Screen  NIPS: xx  TDaP vaccine    Hgb A1C or  GTT Early : Third trimester : 132  Covid    LAB RESULTS   Rhogam  12/14 Blood Type O/Negative/-- (06/29 1544)   Feeding Plan breast Antibody Negative (06/29 1544)  Contraception Vasectomy, POP Rubella 1.16 (06/29 1544)  Circumcision na RPR Non Reactive (06/29 1544)   Pediatrician   HBsAg Negative (06/29 1544)   Support Person [name], mother HIV Non Reactive (06/29 1544)  Prenatal Classes  Varicella  immune    GBS  (For PCN allergy, check sensitivities)   BTL Consent     VBAC Consent  Pap  2021 NIL    Hgb Electro      CF      SMA         History of one kidney           Preterm labor symptoms and general obstetric precautions including but not limited to vaginal bleeding, contractions, leaking of fluid and fetal movement were reviewed in detail with the patient. Please refer to After Visit Summary for other counseling recommendations.   Rhogam given   Follow up in 2 weeks  Carie Caddy, CNM  Domingo Pulse, [facility]  12/19/20  8:59 AM

## 2020-12-24 ENCOUNTER — Telehealth: Payer: Self-pay

## 2020-12-24 NOTE — Telephone Encounter (Signed)
Pt called after hour nurse 12/22/20 9:26am stating she has tested positive for covid; has runny nose, congestion and it is beginning to drain so her throat is kind of sore; no vaccination; no fever; sxs started a couple days ago.  Pt was adv by PH to take tylenol for fever and aches; sudafed for congestion and runny nose, and robitussin DM for cough. [phone]  Called to be sure pt recv'd PH's adv; she did and states she is starting to feel better.

## 2021-01-02 ENCOUNTER — Ambulatory Visit (INDEPENDENT_AMBULATORY_CARE_PROVIDER_SITE_OTHER): Payer: Medicaid Other | Admitting: Licensed Practical Nurse

## 2021-01-02 ENCOUNTER — Encounter: Payer: Self-pay | Admitting: Licensed Practical Nurse

## 2021-01-02 ENCOUNTER — Other Ambulatory Visit: Payer: Self-pay

## 2021-01-02 VITALS — BP 122/80 | Wt 234.0 lb

## 2021-01-02 DIAGNOSIS — Z3685 Encounter for antenatal screening for Streptococcus B: Secondary | ICD-10-CM

## 2021-01-02 DIAGNOSIS — Z3A33 33 weeks gestation of pregnancy: Secondary | ICD-10-CM

## 2021-01-02 DIAGNOSIS — Z905 Acquired absence of kidney: Secondary | ICD-10-CM

## 2021-01-02 DIAGNOSIS — Z3480 Encounter for supervision of other normal pregnancy, unspecified trimester: Secondary | ICD-10-CM

## 2021-01-02 LAB — POCT URINALYSIS DIPSTICK OB
Glucose, UA: NEGATIVE
POC,PROTEIN,UA: NEGATIVE

## 2021-01-02 NOTE — Progress Notes (Signed)
Routine Prenatal Care Visit  Subjective  Courtney Johnston is a 30 y.o. G2P1001 at [redacted]w[redacted]d being seen today for ongoing prenatal care.  She is currently monitored for the following issues for this low-risk pregnancy and has Congenital single kidney; CKD (chronic kidney disease) stage 1, GFR 90 ml/min or greater; Supervision of other normal pregnancy, antepartum; and Labor and delivery, indication for care on their problem list.  ----------------------------------------------------------------------------------- Patient reports no complaints.  Recovered from COVID last week. Has a shower soon, then will get rest of her baby supplies.   .  .   Pincus Large Fluid denies.  ----------------------------------------------------------------------------------- The following portions of the patient's history were reviewed and updated as appropriate: allergies, current medications, past family history, past medical history, past social history, past surgical history and problem list. Problem list updated.  Objective  Blood pressure 122/80, weight 234 lb (106.1 kg), last menstrual period 05/10/2020. Pregravid weight 205 lb (93 kg) Total Weight Gain 29 lb (13.2 kg) Urinalysis: Urine Protein Negative  Urine Glucose Negative  Fetal Status: Fetal Heart Rate (bpm): 155 Fundal Height: 35 cm Movement: Present     General:  Alert, oriented and cooperative. Patient is in no acute distress.  Skin: Skin is warm and dry. No rash noted.   Cardiovascular: Normal heart rate noted  Respiratory: Normal respiratory effort, no problems with respiration noted  Abdomen: Soft, gravid, appropriate for gestational age.       Pelvic:  Cervical exam deferred        Extremities: Normal range of motion.     Mental Status: Normal mood and affect. Normal behavior. Normal judgment and thought content.   Assessment   30 y.o. G2P1001 at [redacted]w[redacted]d by  02/14/2021, by Last Menstrual Period presenting for routine prenatal visit  Plan    pregnancy2 Problems (from 07/04/20 to present)     Problem Noted Resolved   Supervision of other normal pregnancy, antepartum 07/04/2020 by Natale Milch, MD No   Overview Addendum 12/19/2020  8:54 AM by Ellwood Sayers, CNM     Nursing Staff Provider  Office Location  [facility] Dating   lMP= 8 wk [place]  Language  English Anatomy US   normal  Flu Vaccine  09/07/20 Genetic Screen  NIPS: xx  TDaP vaccine    Hgb A1C or  GTT Early : Third trimester : 132  Covid    LAB RESULTS   Rhogam  12/14 Blood Type O/Negative/-- (06/29 1544)   Feeding Plan breast Antibody Negative (06/29 1544)  Contraception Vasectomy, POP Rubella 1.16 (06/29 1544)  Circumcision na RPR Non Reactive (06/29 1544)   Pediatrician   HBsAg Negative (06/29 1544)   Support Person [name], mother HIV Non Reactive (06/29 1544)  Prenatal Classes  Varicella  immune    GBS  (For PCN allergy, check sensitivities)   BTL Consent     VBAC Consent  Pap  2021 NIL    Hgb Electro      CF      SMA         History of one kidney            Preterm labor symptoms and general obstetric precautions including but not limited to vaginal bleeding, contractions, leaking of fluid and fetal movement were reviewed in detail with the patient. Please refer to After Visit Summary for other counseling recommendations.   Return in about 2 weeks (around 01/16/2021) for ROB 36 wk labs, then weekly after that .  36 labs and  creatine next visit.    Carie Caddy, CNM  Domingo Pulse, [facility]  01/02/21  8:59 AM

## 2021-01-02 NOTE — Progress Notes (Signed)
ROB - no concerns. RM 4 °

## 2021-01-06 NOTE — L&D Delivery Note (Addendum)
Delivery Note At  a viable female was delivered via  (Presentation:  ROA).  APGAR: 9, 9; weight pending.   Placenta status:  spontaneous intact  Without complications:  .  Cord pH: N/A  Anesthesia: Epidural Episiotomy:  none Lacerations:  none Suture Repair:  N/A Est. Blood Loss (mL):   Mom to postpartum.  Baby to Couplet care / Skin to Skin.  Vena Austria 01/26/2021, 2:37 AM

## 2021-01-16 ENCOUNTER — Encounter: Payer: Self-pay | Admitting: Licensed Practical Nurse

## 2021-01-16 ENCOUNTER — Other Ambulatory Visit: Payer: Self-pay

## 2021-01-16 ENCOUNTER — Encounter: Payer: Self-pay | Admitting: Obstetrics & Gynecology

## 2021-01-16 ENCOUNTER — Observation Stay
Admission: EM | Admit: 2021-01-16 | Discharge: 2021-01-16 | Disposition: A | Discharge status: Home / Self Care | Payer: Medicaid Other | Attending: Obstetrics & Gynecology | Admitting: Obstetrics & Gynecology

## 2021-01-16 ENCOUNTER — Ambulatory Visit (INDEPENDENT_AMBULATORY_CARE_PROVIDER_SITE_OTHER): Payer: Medicaid Other | Admitting: Licensed Practical Nurse

## 2021-01-16 VITALS — BP 142/86 | Wt 236.0 lb

## 2021-01-16 DIAGNOSIS — Z3A35 35 weeks gestation of pregnancy: Secondary | ICD-10-CM | POA: Diagnosis not present

## 2021-01-16 DIAGNOSIS — Z7982 Long term (current) use of aspirin: Secondary | ICD-10-CM | POA: Diagnosis not present

## 2021-01-16 DIAGNOSIS — O163 Unspecified maternal hypertension, third trimester: Secondary | ICD-10-CM | POA: Diagnosis not present

## 2021-01-16 DIAGNOSIS — Z905 Acquired absence of kidney: Secondary | ICD-10-CM

## 2021-01-16 DIAGNOSIS — Z3480 Encounter for supervision of other normal pregnancy, unspecified trimester: Secondary | ICD-10-CM

## 2021-01-16 DIAGNOSIS — O99019 Anemia complicating pregnancy, unspecified trimester: Secondary | ICD-10-CM

## 2021-01-16 DIAGNOSIS — O133 Gestational [pregnancy-induced] hypertension without significant proteinuria, third trimester: Secondary | ICD-10-CM | POA: Diagnosis not present

## 2021-01-16 DIAGNOSIS — Z3685 Encounter for antenatal screening for Streptococcus B: Secondary | ICD-10-CM

## 2021-01-16 DIAGNOSIS — D509 Iron deficiency anemia, unspecified: Secondary | ICD-10-CM

## 2021-01-16 HISTORY — DX: Unspecified maternal hypertension, third trimester: O16.3

## 2021-01-16 LAB — CBC
HCT: 30.5 % — ABNORMAL LOW (ref 36.0–46.0)
Hemoglobin: 10.5 g/dL — ABNORMAL LOW (ref 12.0–15.0)
MCH: 30.4 pg (ref 26.0–34.0)
MCHC: 34.4 g/dL (ref 30.0–36.0)
MCV: 88.4 fL (ref 80.0–100.0)
Platelets: 215 10*3/uL (ref 150–400)
RBC: 3.45 MIL/uL — ABNORMAL LOW (ref 3.87–5.11)
RDW: 14.4 % (ref 11.5–15.5)
WBC: 9.4 10*3/uL (ref 4.0–10.5)
nRBC: 0 % (ref 0.0–0.2)

## 2021-01-16 LAB — POCT URINALYSIS DIPSTICK OB
Glucose, UA: NEGATIVE
POC,PROTEIN,UA: NEGATIVE

## 2021-01-16 LAB — COMPREHENSIVE METABOLIC PANEL
ALT: 16 U/L (ref 0–44)
AST: 22 U/L (ref 15–41)
Albumin: 2.9 g/dL — ABNORMAL LOW (ref 3.5–5.0)
Alkaline Phosphatase: 88 U/L (ref 38–126)
Anion gap: 8 (ref 5–15)
BUN: 7 mg/dL (ref 6–20)
CO2: 20 mmol/L — ABNORMAL LOW (ref 22–32)
Calcium: 8.6 mg/dL — ABNORMAL LOW (ref 8.9–10.3)
Chloride: 108 mmol/L (ref 98–111)
Creatinine, Ser: 0.68 mg/dL (ref 0.44–1.00)
GFR, Estimated: >60 mL/min (ref >60)
Glucose, Bld: 79 mg/dL (ref 70–99)
Potassium: 3.9 mmol/L (ref 3.5–5.1)
Sodium: 136 mmol/L (ref 135–145)
Total Bilirubin: 0.2 mg/dL — ABNORMAL LOW (ref 0.3–1.2)
Total Protein: 6.6 g/dL (ref 6.5–8.1)

## 2021-01-16 LAB — GROUP B STREP BY PCR: Group B strep by PCR: POSITIVE — AB

## 2021-01-16 LAB — PROTEIN / CREATININE RATIO, URINE
Creatinine, Urine: 46 mg/dL
Protein Creatinine Ratio: 0.24 mg/mg{Cre} — ABNORMAL HIGH (ref 0.00–0.15)
Total Protein, Urine: 11 mg/dL

## 2021-01-16 NOTE — Final Progress Note (Signed)
Final Progress Note  Patient ID: Courtney Johnston MRN: [id number] DOB/AGE: 03/11/1990 31 y.o.  Admit date: 01/16/2021 Admitting provider: Mirna Mires, CNM Discharge date: 01/16/2021   Admission Diagnoses: elevated blood pressure in pregnancy  Discharge Diagnoses:  Active Problems:   Elevated blood pressure affecting pregnancy in third trimester, antepartum  Reactive fetal heart tones  History of Present Illness: The patient is a 31 y.o. female G2P1001 at [redacted]w[redacted]d who presents for evaluation of two elevated blood pressures noted today at her prenatal visit to [place]..she is a Gravida 2 Para 1, with no hx of PIH previously. She denies any headache or visual changes. Does share that she is under stress today due to some concerns regarding her son's teacher at school. Her baby has been moving well and she denies feeling any contractions. Her husband is with her and supportive.   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   Current Outpatient Medications on File Prior to Encounter  Medication Sig Dispense Refill   aspirin 81 MG chewable tablet Chew by mouth.     ferrous sulfate 325 (65 FE) MG EC tablet Take 325 mg by mouth daily with breakfast.     Prenatal Vit-Fe Fumarate-FA (PRENATAL MULTIVITAMIN) TABS tablet Take 1 tablet by mouth daily at 12 noon.     Prenatal 28-0.8 MG TABS Take by mouth.      No Known Allergies  Social History   Socioeconomic History   Marital status: Single    Spouse name: Letitia Libra   Number of children: 0   Years of education: 12   Highest education level: Not on file  Occupational History   Occupation: Caregiver  Tobacco Use   Smoking status:  Never   Smokeless tobacco: Never  Vaping Use   Vaping Use: Never used  Substance and Sexual Activity   Alcohol use: Not Currently    Comment: occasionally.    Drug use: No   Sexual activity: Yes    Birth control/protection: Pill  Other Topics Concern   Not on file  Social History Narrative   Hobbies: Reading. She enjoys spending time with family   Caffeine: 1 cup of coffee daily   Exercise: once a week. Walking on treadmill   Social Determinants of Health   Financial Resource Strain: Not on file  Food Insecurity: Not on file  Transportation Needs: Not on file  Physical Activity: Not on file  Stress: Not on file  Social Connections: Not on file  Intimate Partner Violence: Not on file    Family History  Problem Relation Age of Onset   Arthritis Mother    Heart disease Mother    Hypertension Mother    Asthma Father    Early death Father        Drowning   Hypertension Maternal Aunt    Heart disease Maternal Uncle    Hypertension Maternal Uncle    Heart disease Maternal Grandmother    Hypertension Maternal Grandmother    Arthritis Maternal Grandfather    Hypertension Maternal Grandfather    Diabetes Paternal Grandfather      ROS   Physical Exam: BP 135/76  Pulse 85    Temp 98.4 F (36.9 C) (Oral)    Resp 17    Ht 5\' 6"  (1.676 m)    Wt 107 kg    LMP 05/10/2020    BMI 38.07 kg/m   OBGyn Exam  Consults: None  Significant Findings/ Diagnostic Studies: labs:  Results for orders placed or performed during the hospital encounter of 01/16/21 (from the past 24 hour(s))  Protein / creatinine ratio, urine     Status: Abnormal   Collection Time: 01/16/21 11:34 AM  Result Value Ref Range   Creatinine, Urine 46 mg/dL   Total Protein, Urine 11 mg/dL   Protein Creatinine Ratio 0.24 (H) 0.00 - 0.15 mg/mg[Cre]  Group B strep by PCR     Status: Abnormal   Collection Time: 01/16/21 11:34 AM   Specimen: Vaginal/Rectal; Genital  Result Value Ref Range   Group B strep by PCR  POSITIVE (A) NEGATIVE  CBC     Status: Abnormal   Collection Time: 01/16/21 11:41 AM  Result Value Ref Range   WBC 9.4 4.0 - 10.5 K/uL   RBC 3.45 (L) 3.87 - 5.11 MIL/uL   Hemoglobin 10.5 (L) 12.0 - 15.0 g/dL   HCT 03/16/21 (L) 84.1 - 66.0 %   MCV 88.4 80.0 - 100.0 fL   MCH 30.4 26.0 - 34.0 pg   MCHC 34.4 30.0 - 36.0 g/dL   RDW 63.0 16.0 - 10.9 %   Platelets 215 150 - 400 K/uL   nRBC 0.0 0.0 - 0.2 %  Comprehensive metabolic panel     Status: Abnormal   Collection Time: 01/16/21 11:41 AM  Result Value Ref Range   Sodium 136 135 - 145 mmol/L   Potassium 3.9 3.5 - 5.1 mmol/L   Chloride 108 98 - 111 mmol/L   CO2 20 (L) 22 - 32 mmol/L   Glucose, Bld 79 70 - 99 mg/dL   BUN 7 6 - 20 mg/dL   Creatinine, Ser 03/16/21 0.44 - 1.00 mg/dL   Calcium 8.6 (L) 8.9 - 10.3 mg/dL   Total Protein 6.6 6.5 - 8.1 g/dL   Albumin 2.9 (L) 3.5 - 5.0 g/dL   AST 22 15 - 41 U/L   ALT 16 0 - 44 U/L   Alkaline Phosphatase 88 38 - 126 U/L   Total Bilirubin 0.2 (L) 0.3 - 1.2 mg/dL   GFR, Estimated 5.57 >32 mL/min   Anion gap 8 5 - 15     Procedures: NST NST Baseline FHR: 140 beats/min Variability: moderate Accelerations: present Decelerations: absent Tocometry: uterine irritability noted and then over time some mild regular contractions . Pt does not feel these  Interpretation:  INDICATIONS: rule out uterine contractions RESULTS:  A NST procedure was performed with FHR monitoring and a normal baseline established, appropriate time of 20-40 minutes of evaluation, and accels >2 seen w 15x15 characteristics.  Results show a REACTIVE NST.    Hospital Course: The patient was admitted to Labor and Delivery Triage for observation. She was placed on a fetal monitor. A urine sample was sent and PIH labs drawn and sent to the lab. A cervicla exam reveals a closed cervix. Her blood pressures have been monitored repeated and are either WNL or borderline.  Her labwork results do not indicate pre eclampsia. An IV was started  to hydrate her, and her mild contractions dissipated after an IV bolus. After reviewing her labwork and BP measures with Dr. >20, she is discharged heom to take it easy  for the next day, and she will follow up with [facility] at her next week appointment. A careful review of sxs related to hypertension are reviewd with her and [facility] should she develop a  headache, or any edema of note.  Discharge Condition: good  Disposition: Discharge disposition: 01-Home or Self Care       Diet: Regular diet  Discharge Activity: Ambulate in house; rest today and tomorrow.  Discharge Instructions     Fetal Kick Count:  Lie on our left side for one hour after a meal, and count the number of times your baby kicks.  If it is less than 5 times, get up, move around and drink some juice.  Repeat the test 30 minutes later.  If it is still less than 5 kicks in an hour, notify your doctor.   Complete by: As directed    LABOR:  When conractions begin, you should start to time them from the beginning of one contraction to the beginning  of the next.  When contractions are 5 - 10 minutes apart or less and have been regular for at least an hour, you should call your health care provider.   Complete by: As directed    Notify physician for bleeding from the vagina   Complete by: As directed    Notify physician for blurring of vision or spots before the eyes   Complete by: As directed    Notify physician for chills or fever   Complete by: As directed    Notify physician for fainting spells, "black outs" or loss of consciousness   Complete by: As directed    Notify physician for increase in vaginal discharge   Complete by: As directed    Notify physician for leaking of fluid   Complete by: As directed    Notify physician for pain or burning when urinating   Complete by: As directed    Notify physician for pelvic pressure (sudden increase)   Complete by: As directed    Notify  physician for severe or continued nausea or vomiting   Complete by: As directed    Notify physician for sudden gushing of fluid from the vagina (with or without continued leaking)   Complete by: As directed    Notify physician for sudden, constant, or occasional abdominal pain   Complete by: As directed    Notify physician if baby moving less than usual   Complete by: As directed       Allergies as of 01/16/2021   No Known Allergies      Medication List     TAKE these medications    aspirin 81 MG chewable tablet Chew by mouth.   ferrous sulfate 325 (65 FE) MG EC tablet Take 325 mg by mouth daily with breakfast.   Prenatal 28-0.8 MG Tabs Take by mouth.   prenatal multivitamin Tabs tablet Take 1 tablet by mouth daily at 12 noon.         Total time spent taking care of this patient: 40 minutes  Signed: Mirna MiresMargaret M Jonell Krontz, CNM  01/16/2021, 2:24 PM

## 2021-01-16 NOTE — Progress Notes (Signed)
Pt arrived for ROB  Elevated BP x 2 more 15 mins apart. Admits to feeling upset d/t an upsetting phone call she just received.  Chestine Spore CNM on call, called, rec sending pt to L and D triage for evaluation  Briefly reviewed hypertensive disorders in pregnancy and recommend going directly to L and D for an evaluation. Pt agreeable to plan. Will have 36 week labs collected at [facility].  Pt ambulated out of clinic.  Carie Caddy, CNM  Domingo Pulse, [facility]  01/16/21  10:34 AM

## 2021-01-16 NOTE — Progress Notes (Signed)
ROB - GBS, no concerns. RM 4 

## 2021-01-16 NOTE — OB Triage Note (Signed)
Pt G2P1 at [redacted]w[redacted]d sent from office for PIH eval. Pt denies HA, abd pain, vision changes. Denies LOF, bleeding, CTX. +FM. Pt states she received some news that upset her prior to appt this morning. BP cycling Q15. Monitors applied.

## 2021-01-16 NOTE — Discharge Summary (Signed)
Please see Final Progress Note.  Mirna Mires, CNM  01/16/2021 2:20 PM

## 2021-01-23 ENCOUNTER — Other Ambulatory Visit: Payer: Self-pay

## 2021-01-23 ENCOUNTER — Ambulatory Visit (INDEPENDENT_AMBULATORY_CARE_PROVIDER_SITE_OTHER): Payer: Medicaid Other | Admitting: Obstetrics

## 2021-01-23 VITALS — BP 142/80 | Wt 238.0 lb

## 2021-01-23 DIAGNOSIS — O133 Gestational [pregnancy-induced] hypertension without significant proteinuria, third trimester: Secondary | ICD-10-CM

## 2021-01-23 DIAGNOSIS — Z3A37 37 weeks gestation of pregnancy: Secondary | ICD-10-CM

## 2021-01-23 DIAGNOSIS — Z3480 Encounter for supervision of other normal pregnancy, unspecified trimester: Secondary | ICD-10-CM

## 2021-01-23 NOTE — H&P (Addendum)
Courtney Johnston is a 31 y.o. female presenting for induction of labor due to gestational hypertension at 37 weeks 1 day. Her blood pressures have been slightly elevated over the lasst few weeks, and a decision was made this week to induce labor.. 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 Social History:  reports that she has never smoked. She has never used smokeless tobacco. She reports that she does not currently use alcohol. She reports that she does not use drugs.     Maternal Diabetes: No Genetic Screening: Normal Maternal Ultrasounds/Referrals: Normal Fetal Ultrasounds or other Referrals:  None Maternal Substance Abuse:  No Significant Maternal Medications:  None Significant Maternal Lab Results:  Group B Strep positive Other Comments:  None  Review of Systems  Constitutional: Negative.   HENT: Negative.    Eyes: Negative.   Respiratory: Negative.    Cardiovascular: Negative.   Gastrointestinal: Negative.   Endocrine: Negative.   Genitourinary: Negative.   Allergic/Immunologic: Negative.   Neurological: Negative.   Hematological: Negative.   Psychiatric/Behavioral: Negative.    History   Blood pressure (!) 142/80, weight  238 lb (108 kg), last menstrual period 05/10/2020. Maternal Exam:  Uterine Assessment: Contraction strength is mild.  Contraction frequency is rare.  Abdomen: Fundal height is 38 cms.   Fetal presentation: vertex Introitus: Normal vulva. Normal vagina.  Pelvis: adequate for delivery.   Cervix: not evaluated.  Physical Exam Constitutional:      Appearance: Normal appearance. She is obese.  HENT:     Head: Normocephalic and atraumatic.  Cardiovascular:     Rate and Rhythm: Normal rate and regular rhythm.  Pulmonary:     Effort: Pulmonary effort is normal.     Breath sounds: Normal breath sounds.  Abdominal:     Comments: Gravid abdomen. Adipose, high BMI  Genitourinary:    General: Normal vulva.  Musculoskeletal:        General: Normal range of motion.     Cervical back: Normal range of motion.  Skin:    General: Skin is warm and dry.  Neurological:     General: No focal deficit present.     Mental Status: She is alert and oriented to person, place, and time.  Psychiatric:        Mood and Affect: Mood normal.        Behavior: Behavior normal.    Prenatal labs: ABO, Rh: O/Negative/-- (06/29 1544) Antibody: Negative (11/18 0955) Rubella: 1.16 (06/29 1544) RPR: Non Reactive (  11/30 1057)  HBsAg: Negative (06/29 1544)  HIV: Non Reactive (11/30 1057)  GBS: POSITIVE/-- (01/11 1134)   Assessment/Plan: IUP 37 weeks 1 day Gestational Hypertension Admitted for induction of labor. Needs rapid COVID test. Routine labs ( [facility]H labs drawn at [facility] on 01/23/[facility]21) IV access Continuous EFM  Follow blood pressures carefully Cytotec planned or foley, then likely pitocin. Anticipate epidural once active.  Mirna Mires, CNM  01/23/2021 5:29 PM     Mirna Mires 01/23/2021, 5:23 PM

## 2021-01-23 NOTE — Progress Notes (Signed)
Routine Prenatal Care Visit  Subjective  Courtney Johnston is a 31 y.o. G2P1001 at [redacted]w[redacted]d being seen today for ongoing prenatal care.  She is currently monitored for the following issues for this high-risk pregnancy and has Congenital single kidney; CKD (chronic kidney disease) stage 1, GFR 90 ml/min or greater; Supervision of other normal pregnancy, antepartum; Labor and delivery, indication for care; and Elevated blood pressure affecting pregnancy in third trimester, antepartum on their problem list.  ----------------------------------------------------------------------------------- Patient reports no bleeding, no contractions and no cramping.  She denies any headache, visual changes.  .  .   Jacklyn Shell Fluid denies.  ----------------------------------------------------------------------------------- The following portions of the patient's history were reviewed and updated as appropriate: allergies, current medications, past family history, past medical history, past social history, past surgical history and problem list. Problem list updated.  Objective  Blood pressure (!) 142/80, weight 238 lb (108 kg), last menstrual period 05/10/2020. Pregravid weight 205 lb (93 kg) Total Weight Gain 33 lb (15 kg) Urinalysis: Urine Protein    Urine Glucose    Fetal Status:           General:  Alert, oriented and cooperative. Patient is in no acute distress.  Skin: Skin is warm and dry. No rash noted.   Cardiovascular: Normal heart rate noted  Respiratory: Normal respiratory effort, no problems with respiration noted  Abdomen: Soft, gravid, appropriate for gestational age.       Pelvic:  Cervical exam deferred        Extremities: Normal range of motion.     Mental Status: Normal mood and affect. Normal behavior. Normal judgment and thought content.   Assessment   31 y.o. G2P1001 at [redacted]w[redacted]d by  02/14/2021, by Last Menstrual Period presenting for routine prenatal visit  Plan   pregnancy2 Problems  (from 07/04/20 to present)    Problem Noted Resolved   Supervision of other normal pregnancy, antepartum 07/04/2020 by Homero Fellers, MD No   Overview Addendum 12/19/2020  8:54 AM by Allen Derry, [place] Staff Provider  Office Location  [facility] Dating   lMP= 8 wk [place]  Language  English Anatomy US   normal  Flu Vaccine  09/07/20 Genetic Screen  NIPS: xx  TDaP vaccine    Hgb A1C or  GTT Early : Third trimester : 132  Covid    LAB RESULTS   Rhogam  12/14 Blood Type O/Negative/-- (06/29 1544)   Feeding Plan breast Antibody Negative (06/29 1544)  Contraception Vasectomy, POP Rubella 1.16 (06/29 1544)  Circumcision na RPR Non Reactive (06/29 1544)   Pediatrician   HBsAg Negative (06/29 1544)   Support Person Annie Main, mother HIV Non Reactive (06/29 1544)  Prenatal Classes  Varicella  immune    GBS  (For PCN allergy, check sensitivities)   BTL Consent     VBAC Consent  Pap  2021 NIL    Hgb Electro      CF      SMA         History of one kidney           Term labor symptoms and general obstetric precautions including but not limited to vaginal bleeding, contractions, leaking of fluid and fetal movement were reviewed in detail with the patient. Please refer to After Visit Summary for other counseling recommendations.  She again has elevated blood pressures- PIH panel drawn in the office today.  Discussed with Dr. Kenton Kingfisher and then Dr. Georgianne Fick, who recommends  IOL  At 37 weeks. The patient has been notified and IOL is set up for  two days hence at 0800. Discussed option of cytotec or foley and then pitocin. The possible use of magnesium for severe BPs also mentioned. Admission orders placed. H and P completed.  Imagene Riches, CNM  01/23/2021 5:18 PM     Imagene Riches, CNM  01/23/2021 12:03 PM

## 2021-01-23 NOTE — Progress Notes (Signed)
No vb. No lof. BP recheck 148/84

## 2021-01-23 NOTE — Addendum Note (Signed)
Addended by: Mirna Mires on: 01/23/2021 05:33 PM   Modules accepted: Orders

## 2021-01-24 LAB — CBC WITH DIFFERENTIAL
Basophils Absolute: 0 10*3/uL (ref 0.0–0.2)
Basos: 0 %
EOS (ABSOLUTE): 0 10*3/uL (ref 0.0–0.4)
Eos: 0 %
Hematocrit: 31.5 % — ABNORMAL LOW (ref 34.0–46.6)
Hemoglobin: 11 g/dL — ABNORMAL LOW (ref 11.1–15.9)
Immature Grans (Abs): 0.1 10*3/uL (ref 0.0–0.1)
Immature Granulocytes: 1 %
Lymphocytes Absolute: 1.6 10*3/uL (ref 0.7–3.1)
Lymphs: 17 %
MCH: 30.4 pg (ref 26.6–33.0)
MCHC: 34.9 g/dL (ref 31.5–35.7)
MCV: 87 fL (ref 79–97)
Monocytes Absolute: 0.6 10*3/uL (ref 0.1–0.9)
Monocytes: 6 %
Neutrophils Absolute: 7 10*3/uL (ref 1.4–7.0)
Neutrophils: 76 %
RBC: 3.62 x10E6/uL — ABNORMAL LOW (ref 3.77–5.28)
RDW: 13.9 % (ref 11.7–15.4)
WBC: 9.3 10*3/uL (ref 3.4–10.8)

## 2021-01-24 LAB — COMPREHENSIVE METABOLIC PANEL
ALT: 11 IU/L (ref 0–32)
AST: 14 IU/L (ref 0–40)
Albumin/Globulin Ratio: 1.5 (ref 1.2–2.2)
Albumin: 3.6 g/dL — ABNORMAL LOW (ref 3.8–4.8)
Alkaline Phosphatase: 104 IU/L (ref 44–121)
BUN/Creatinine Ratio: 9 (ref 9–23)
BUN: 7 mg/dL (ref 6–20)
Bilirubin Total: <0.2 mg/dL (ref 0.0–1.2)
CO2: 20 mmol/L (ref 20–29)
Calcium: 8.4 mg/dL — ABNORMAL LOW (ref 8.7–10.2)
Chloride: 104 mmol/L (ref 96–106)
Creatinine, Ser: 0.74 mg/dL (ref 0.57–1.00)
Globulin, Total: 2.4 g/dL (ref 1.5–4.5)
Glucose: 70 mg/dL (ref 70–99)
Potassium: 4.2 mmol/L (ref 3.5–5.2)
Sodium: 136 mmol/L (ref 134–144)
Total Protein: 6 g/dL (ref 6.0–8.5)
eGFR: 110 mL/min/{1.73_m2} (ref >59)

## 2021-01-25 ENCOUNTER — Other Ambulatory Visit: Payer: Self-pay

## 2021-01-25 ENCOUNTER — Inpatient Hospital Stay
Admission: RE | Admit: 2021-01-25 | Discharge: 2021-01-27 | DRG: 806 | Disposition: A | Discharge status: Home / Self Care | Payer: Medicaid Other | Source: Ambulatory Visit | Attending: Obstetrics and Gynecology | Admitting: Obstetrics and Gynecology

## 2021-01-25 ENCOUNTER — Encounter: Payer: Self-pay | Admitting: Obstetrics and Gynecology

## 2021-01-25 ENCOUNTER — Inpatient Hospital Stay: Payer: Medicaid Other | Admitting: Anesthesiology

## 2021-01-25 DIAGNOSIS — O134 Gestational [pregnancy-induced] hypertension without significant proteinuria, complicating childbirth: Principal | ICD-10-CM | POA: Diagnosis present

## 2021-01-25 DIAGNOSIS — Z3480 Encounter for supervision of other normal pregnancy, unspecified trimester: Secondary | ICD-10-CM

## 2021-01-25 DIAGNOSIS — D62 Acute posthemorrhagic anemia: Secondary | ICD-10-CM | POA: Diagnosis not present

## 2021-01-25 DIAGNOSIS — O133 Gestational [pregnancy-induced] hypertension without significant proteinuria, third trimester: Secondary | ICD-10-CM | POA: Diagnosis not present

## 2021-01-25 DIAGNOSIS — Z3A37 37 weeks gestation of pregnancy: Secondary | ICD-10-CM

## 2021-01-25 DIAGNOSIS — O9903 Anemia complicating the puerperium: Secondary | ICD-10-CM | POA: Diagnosis not present

## 2021-01-25 DIAGNOSIS — O9081 Anemia of the puerperium: Secondary | ICD-10-CM | POA: Diagnosis not present

## 2021-01-25 DIAGNOSIS — Z7982 Long term (current) use of aspirin: Secondary | ICD-10-CM | POA: Diagnosis not present

## 2021-01-25 HISTORY — DX: Chronic kidney disease, unspecified: N18.9

## 2021-01-25 LAB — CBC
HCT: 31.3 % — ABNORMAL LOW (ref 36.0–46.0)
Hemoglobin: 10.6 g/dL — ABNORMAL LOW (ref 12.0–15.0)
MCH: 30.2 pg (ref 26.0–34.0)
MCHC: 33.9 g/dL (ref 30.0–36.0)
MCV: 89.2 fL (ref 80.0–100.0)
Platelets: 225 10*3/uL (ref 150–400)
RBC: 3.51 MIL/uL — ABNORMAL LOW (ref 3.87–5.11)
RDW: 14.4 % (ref 11.5–15.5)
WBC: 8.2 10*3/uL (ref 4.0–10.5)
nRBC: 0 % (ref 0.0–0.2)

## 2021-01-25 LAB — TYPE AND SCREEN
ABO/RH(D): O NEG
Antibody Screen: POSITIVE

## 2021-01-25 MED ORDER — LIDOCAINE HCL (PF) 1 % IJ SOLN
INTRAMUSCULAR | Status: DC | PRN
  Administered 2021-01-25 (×3): 2 mL via SUBCUTANEOUS

## 2021-01-25 MED ORDER — ACETAMINOPHEN 325 MG PO TABS: 650.0000 mg | ORAL_TABLET | ORAL | Status: DC | PRN

## 2021-01-25 MED ORDER — ONDANSETRON HCL 4 MG/2ML IJ SOLN: 4.0000 mg | Freq: Four times a day (QID) | INTRAMUSCULAR | Status: DC | PRN

## 2021-01-25 MED ORDER — FENTANYL-BUPIVACAINE-NACL 0.5-0.125-0.9 MG/250ML-% EP SOLN: 12.0000 mL/h | EPIDURAL | Status: DC | PRN

## 2021-01-25 MED ORDER — PHENYLEPHRINE 40 MCG/ML (10ML) SYRINGE FOR IV PUSH (FOR BLOOD PRESSURE SUPPORT)
80.0000 ug | PREFILLED_SYRINGE | INTRAVENOUS | Status: DC | PRN
  Filled 2021-01-25: qty 10

## 2021-01-25 MED ORDER — FENTANYL-BUPIVACAINE-NACL 0.5-0.125-0.9 MG/250ML-% EP SOLN
EPIDURAL | Status: AC
  Filled 2021-01-25: qty 250

## 2021-01-25 MED ORDER — BUPIVACAINE HCL (PF) 0.25 % IJ SOLN
INTRAMUSCULAR | Status: DC | PRN
  Administered 2021-01-25 (×2): 4 mL via EPIDURAL

## 2021-01-25 MED ORDER — LACTATED RINGERS IV SOLN: 500.0000 mL | INTRAVENOUS | Status: DC | PRN

## 2021-01-25 MED ORDER — SODIUM CHLORIDE 0.9 % IV SOLN
5.0000 10*6.[IU] | Freq: Once | INTRAVENOUS | Status: AC
  Administered 2021-01-25: 5 10*6.[IU] via INTRAVENOUS
  Filled 2021-01-25: qty 5

## 2021-01-25 MED ORDER — EPHEDRINE 5 MG/ML INJ
10.0000 mg | INTRAVENOUS | Status: DC | PRN
  Filled 2021-01-25: qty 2

## 2021-01-25 MED ORDER — LACTATED RINGERS IV SOLN: INTRAVENOUS | Status: DC

## 2021-01-25 MED ORDER — MISOPROSTOL 25 MCG QUARTER TABLET
25.0000 ug | ORAL_TABLET | ORAL | Status: DC | PRN
  Administered 2021-01-25 (×2): 25 ug via VAGINAL
  Filled 2021-01-25 (×4): qty 1

## 2021-01-25 MED ORDER — OXYTOCIN-SODIUM CHLORIDE 30-0.9 UT/500ML-% IV SOLN
2.5000 [IU]/h | INTRAVENOUS | Status: DC
  Filled 2021-01-25: qty 500

## 2021-01-25 MED ORDER — OXYTOCIN-SODIUM CHLORIDE 30-0.9 UT/500ML-% IV SOLN
INTRAVENOUS | Status: AC
  Administered 2021-01-26: 2 m[IU]/min via INTRAVENOUS
  Filled 2021-01-25: qty 500

## 2021-01-25 MED ORDER — LACTATED RINGERS IV SOLN
500.0000 mL | Freq: Once | INTRAVENOUS | Status: AC
  Administered 2021-01-25: 500 mL via INTRAVENOUS

## 2021-01-25 MED ORDER — OXYCODONE-ACETAMINOPHEN 5-325 MG PO TABS: 2.0000 | ORAL_TABLET | ORAL | Status: DC | PRN

## 2021-01-25 MED ORDER — LIDOCAINE-EPINEPHRINE (PF) 1.5 %-1:200000 IJ SOLN
INTRAMUSCULAR | Status: DC | PRN
  Administered 2021-01-25: 3 mL via EPIDURAL

## 2021-01-25 MED ORDER — TERBUTALINE SULFATE 1 MG/ML IJ SOLN: 0.2500 mg | Freq: Once | INTRAMUSCULAR | Status: DC | PRN

## 2021-01-25 MED ORDER — SOD CITRATE-CITRIC ACID 500-334 MG/5ML PO SOLN: 30.0000 mL | ORAL | Status: DC | PRN

## 2021-01-25 MED ORDER — MISOPROSTOL 200 MCG PO TABS
ORAL_TABLET | ORAL | Status: AC
  Filled 2021-01-25: qty 4

## 2021-01-25 MED ORDER — PENICILLIN G POT IN DEXTROSE 60000 UNIT/ML IV SOLN
3.0000 10*6.[IU] | INTRAVENOUS | Status: DC
  Administered 2021-01-25 (×3): 3 10*6.[IU] via INTRAVENOUS
  Filled 2021-01-25 (×5): qty 50

## 2021-01-25 MED ORDER — LIDOCAINE HCL (PF) 1 % IJ SOLN
30.0000 mL | INTRAMUSCULAR | Status: DC | PRN
  Filled 2021-01-25: qty 30

## 2021-01-25 MED ORDER — FENTANYL-BUPIVACAINE-NACL 0.5-0.125-0.9 MG/250ML-% EP SOLN
EPIDURAL | Status: DC | PRN
  Administered 2021-01-25: 12 mL/h via EPIDURAL

## 2021-01-25 MED ORDER — BUTORPHANOL TARTRATE 1 MG/ML IJ SOLN: 1.0000 mg | INTRAMUSCULAR | Status: DC | PRN

## 2021-01-25 MED ORDER — OXYCODONE-ACETAMINOPHEN 5-325 MG PO TABS: 1.0000 | ORAL_TABLET | ORAL | Status: DC | PRN

## 2021-01-25 MED ORDER — DIPHENHYDRAMINE HCL 50 MG/ML IJ SOLN: 12.5000 mg | INTRAMUSCULAR | Status: DC | PRN

## 2021-01-25 MED ORDER — LIDOCAINE HCL (PF) 1 % IJ SOLN
INTRAMUSCULAR | Status: AC
  Filled 2021-01-25: qty 30

## 2021-01-25 MED ORDER — OXYTOCIN-SODIUM CHLORIDE 30-0.9 UT/500ML-% IV SOLN: 1.0000 m[IU]/min | INTRAVENOUS | Status: DC

## 2021-01-25 MED ORDER — AMMONIA AROMATIC IN INHA
RESPIRATORY_TRACT | Status: AC
  Filled 2021-01-25: qty 10

## 2021-01-25 MED ORDER — OXYTOCIN BOLUS FROM INFUSION
333.0000 mL | Freq: Once | INTRAVENOUS | Status: AC
  Administered 2021-01-26: 333 mL via INTRAVENOUS

## 2021-01-25 MED ORDER — SODIUM CHLORIDE (PF) 0.9 % IJ SOLN
INTRAMUSCULAR | Status: AC
  Filled 2021-01-25: qty 50

## 2021-01-25 MED ORDER — OXYTOCIN 10 UNIT/ML IJ SOLN
INTRAMUSCULAR | Status: AC
  Filled 2021-01-25: qty 2

## 2021-01-25 NOTE — Anesthesia Procedure Notes (Addendum)
Epidural Patient location during procedure: OB Start time: 01/25/2021 9:24 PM End time: 01/25/2021 9:46 PM  Staffing Anesthesiologist: [name], MD Resident/CRNA: Irving Burton, CRNA Performed: resident/CRNA and anesthesiologist   Preanesthetic Checklist Completed: patient identified, IV checked, site marked, risks and benefits discussed, surgical consent, monitors and equipment checked, pre-op evaluation and timeout performed  Epidural Patient position: sitting Prep: ChloraPrep Patient monitoring: heart rate, continuous pulse ox and blood pressure Approach: midline Location: L3-L4 Injection technique: LOR saline  Needle:  Needle type: Tuohy  Needle gauge: 17 G Needle length: 9 cm and 9 Needle insertion depth: 7 cm Catheter type: closed end flexible Catheter size: 19 Gauge Catheter at skin depth: 12 cm Test dose: negative and 1.5% lidocaine with Epi 1:200 K  Assessment Sensory level: T10 Events: blood not aspirated, injection not painful, no injection resistance, no paresthesia and negative IV test  Additional Notes 2nd attempt Pt. Evaluated and documentation done after procedure finished. Patient identified. Risks/Benefits/Options discussed with patient including but not limited to bleeding, infection, nerve damage, paralysis, failed block, incomplete pain control, headache, blood pressure changes, nausea, vomiting, reactions to medication both or allergic, itching and postpartum back pain. Confirmed with bedside nurse the patient's most recent platelet count. Confirmed with patient that they are not currently taking any anticoagulation, have any bleeding history or any family history of bleeding disorders. Patient expressed understanding and wished to proceed. All questions were answered. Sterile technique was used throughout the entire procedure. Please see nursing notes for vital signs. Test dose was given through epidural catheter and negative prior to continuing to  dose epidural or start infusion. Warning signs of high block given to the patient including shortness of breath, tingling/numbness in hands, complete motor block, or any concerning symptoms with instructions to call for help. Patient was given instructions on fall risk and not to get out of bed. All questions and concerns addressed with instructions to call with any issues or inadequate analgesia.    Patient tolerated the insertion well without immediate complications.Reason for block:procedure for pain

## 2021-01-25 NOTE — H&P (Signed)
Obstetric H&P   Chief Complaint: IOL GHTN  Prenatal Care Provider: [facility]  History of Present Illness: 31 y.o. G2P1001 [redacted]w[redacted]d by 02/14/2021, by Last Menstrual Period presenting to L&D for IOL secondary to [facility].  Pregnancy otherwise uncomplicated other medical history significant for single right kidney.  +FM, no LOF, no VB.  Denies headaches, no vision changes, no RUQ or epigastric pain.    Pregravid weight 93 kg Total Weight Gain 15 kg  pregnancy2 Problems (from 07/04/20 to present)     Problem Noted Resolved   Supervision of other normal pregnancy, antepartum 07/04/2020 by Homero Fellers, MD No   Overview Addendum 12/19/2020  8:54 AM by Allen Derry, [place] Staff Provider  Office Location  [facility] Dating   lMP= 8 wk [place]  Language  English Anatomy US   normal  Flu Vaccine  09/07/20 Genetic Screen  NIPS: xx  TDaP vaccine    Hgb A1C or  GTT Early : Third trimester : 132  Covid    LAB RESULTS   Rhogam  12/14 Blood Type O/Negative/-- (06/29 1544)   Feeding Plan breast Antibody Negative (06/29 1544)  Contraception Vasectomy, POP Rubella 1.16 (06/29 1544)  Circumcision na RPR Non Reactive (06/29 1544)   Pediatrician   HBsAg Negative (06/29 1544)   Support Person Annie Main, mother HIV Non Reactive (06/29 1544)  Prenatal Classes  Varicella  immune    GBS  (For PCN allergy, check sensitivities)   BTL Consent     VBAC Consent  Pap  2021 NIL    Hgb Electro      CF      SMA        History of one kidney            Review of Systems: 10 point review of systems negative unless otherwise noted in HPI  Past Medical History: Patient Active Problem List   Diagnosis Date Noted   Elevated blood pressure affecting pregnancy in third trimester, antepartum 01/16/2021   Labor and delivery, indication for care 10/22/2020   Supervision of other normal pregnancy, antepartum 07/04/2020     Nursing Staff Provider  Office Location  [facility] Dating   lMP= 8 wk [place]  Language   English Anatomy US   normal  Flu Vaccine  09/07/20 Genetic Screen  NIPS: xx  TDaP vaccine    Hgb A1C or  GTT Early : Third trimester : 132  Covid    LAB RESULTS   Rhogam  12/14 Blood Type O/Negative/-- (06/29 1544)   Feeding Plan breast Antibody Negative (06/29 1544)  Contraception Vasectomy, POP Rubella 1.16 (06/29 1544)  Circumcision na RPR Non Reactive (06/29 1544)   Pediatrician   HBsAg Negative (06/29 1544)   Support Person Annie Main, mother HIV Non Reactive (06/29 1544)  Prenatal Classes  Varicella  immune    GBS  (For PCN allergy, check sensitivities)   BTL Consent     VBAC Consent  Pap  2021 NIL    Hgb Electro      CF      SMA        History of one kidney     CKD (chronic kidney disease) stage 1, GFR 90 ml/min or greater 10/26/2013   Congenital single kidney 09/15/2013    Past Surgical History: Past Surgical History:  Procedure Laterality Date   KIDNEY SURGERY Right 1990   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 Right kidney surgery for 95%  blockage    Past Obstetric History: # 1 - Date: 05/11/14, Sex: Female, Weight: 3790 g, GA: [redacted]w[redacted]d, Delivery: Vaginal, Spontaneous, Apgar1: 4, Apgar5: 7, Living: Living, Birth Comments: facial bruising  # 2 - Date: None, Sex: None, Weight: None, GA: None, Delivery: None, Apgar1: None, Apgar5: None, Living: None, Birth Comments: None   Past Gynecologic History:  Family History: Family History  Problem Relation Age of Onset   Arthritis Mother    Heart disease Mother    Hypertension Mother    Asthma Father    Early death Father        Drowning   Hypertension Maternal Aunt    Heart disease Maternal Uncle    Hypertension Maternal Uncle    Heart disease Maternal Grandmother    Hypertension Maternal Grandmother    Arthritis Maternal Grandfather    Hypertension Maternal Grandfather    Diabetes Paternal Grandfather     Social History: Social History   Socioeconomic History   Marital status: Single    Spouse name:  Benard Halsted   Number of children: 0   Years of education: 12   Highest education level: Not on file  Occupational History   Occupation: Caregiver  Tobacco Use   Smoking status: Never   Smokeless tobacco: Never  Vaping Use   Vaping Use: Never used  Substance and Sexual Activity   Alcohol use: Not Currently    Comment: occasionally.    Drug use: No   Sexual activity: Yes    Birth control/protection: Pill  Other Topics Concern   Not on file  Social History Narrative   Hobbies: Reading. She enjoys spending time with family   Caffeine: 1 cup of coffee daily   Exercise: once a week. Walking on treadmill   Social Determinants of Health   Financial Resource Strain: Not on file  Food Insecurity: Not on file  Transportation Needs: Not on file  Physical Activity: Not on file  Stress: Not on file  Social Connections: Not on file  Intimate Partner Violence: Not on file    Medications: Prior to Admission medications   Medication Sig Start Date End Date Taking? Authorizing Provider  aspirin 81 MG chewable tablet Chew by mouth.    [provider]  ferrous sulfate 325 (65 FE) MG EC tablet Take 325 mg by mouth daily with breakfast.    [provider]  Prenatal 28-0.8 MG TABS Take by mouth.    [provider]  Prenatal Vit-Fe Fumarate-FA (PRENATAL MULTIVITAMIN) TABS tablet Take 1 tablet by mouth daily at 12 noon.    [provider]    Allergies: No Known Allergies  Physical Exam: Vitals: Blood pressure (!) 134/92, pulse 98, temperature 98.1 F (36.7 C), temperature source Oral, resp. rate 16, height 5\' 6"  (1.676 m), weight 108 kg, last menstrual period 05/10/2020.   FHT:150, moderate, +accels, no decels Toco: none  General: NAD HEENT: normocephalic, anicteric Pulmonary: No increased work of breathing Cardiovascular: RRR, distal pulses 2+ Abdomen: Gravid, non-tender [place]: vtx  Extremities: no edema, erythema, or  tenderness Neurologic: Grossly intact Psychiatric: mood appropriate, affect full  Labs: No results found for this or any previous visit (from the past 24 hour(s)).  Assessment: 31 y.o. G2P1001 [redacted]w[redacted]d by 02/14/2021, by Last Menstrual Period IOL GHTN  Plan: 1) GHTN - proceed with IOL ACOG "Hypertension in Pregnancy" Task Force Report November 14th 2013  - cytotec IOL  2) Fetus - cat I tracing  3) PNL - Blood type O/Negative/-- (06/29 1544) /  Anti-bodyscreen Negative (11/18 0955) / Rubella 1.16 (06/29 1544) / Varicella Immune / RPR Non Reactive (11/30 1057) / HBsAg Negative (06/29 1544) / HIV Non Reactive (11/30 1057) / 1-hr OGTT 133 / GBS POSITIVE/-- (01/11 1134)  4) Immunization History -  Immunization History  Administered Date(s) Administered   Influenza,inj,Quad PF,6+ Mos 10/11/2015, 09/09/2017, 09/07/2020   Td 08/15/2004   Tdap 07/04/2020    5) Disposition - pending delivery  Malachy Mood, MD, [place], [facility] 01/25/2021, 8:06 AM

## 2021-01-25 NOTE — Anesthesia Preprocedure Evaluation (Signed)
Anesthesia Evaluation  Patient identified by MRN, date of birth, ID band Patient awake    Reviewed: Allergy & Precautions, H&P , NPO status , Patient's Chart, lab work & pertinent test results, reviewed documented beta blocker date and time   History of Anesthesia Complications Negative for: history of anesthetic complications  Airway Mallampati: II  TM Distance: >3 FB Neck ROM: full    Dental no notable dental hx.    Pulmonary neg pulmonary ROS,    Pulmonary exam normal breath sounds clear to auscultation       Cardiovascular Exercise Tolerance: Good hypertension, (-) angina(-) Past MI and (-) Cardiac Stents (-) dysrhythmias (-) Valvular Problems/Murmurs Rhythm:regular Rate:Normal     Neuro/Psych negative neurological ROS  negative psych ROS   GI/Hepatic Neg liver ROS, GERD  ,  Endo/Other  negative endocrine ROS  Renal/GU Renal disease  negative genitourinary   Musculoskeletal   Abdominal   Peds  Hematology negative hematology ROS (+) Blood dyscrasia, anemia ,   Anesthesia Other Findings Past Medical History: No date: Anemia No date: Chronic kidney disease No date: History of chicken pox     Comment:  31yrs old   Reproductive/Obstetrics (+) Pregnancy                             Anesthesia Physical  Anesthesia Plan  ASA: 2  Anesthesia Plan: Epidural   Post-op Pain Management:    Induction:   PONV Risk Score and Plan:   Airway Management Planned:   Additional Equipment:   Intra-op Plan:   Post-operative Plan:   Informed Consent: I have reviewed the patients History and Physical, chart, labs and discussed the procedure including the risks, benefits and alternatives for the proposed anesthesia with the patient or authorized representative who has indicated his/her understanding and acceptance.       Plan Discussed with: Anesthesiologist  Anesthesia Plan Comments:          Anesthesia Quick Evaluation

## 2021-01-26 ENCOUNTER — Encounter: Payer: Self-pay | Admitting: Obstetrics and Gynecology

## 2021-01-26 DIAGNOSIS — O134 Gestational [pregnancy-induced] hypertension without significant proteinuria, complicating childbirth: Principal | ICD-10-CM

## 2021-01-26 DIAGNOSIS — O9903 Anemia complicating the puerperium: Secondary | ICD-10-CM

## 2021-01-26 DIAGNOSIS — D62 Acute posthemorrhagic anemia: Secondary | ICD-10-CM

## 2021-01-26 LAB — COMPREHENSIVE METABOLIC PANEL
ALT: 12 U/L (ref 0–44)
AST: 24 U/L (ref 15–41)
Albumin: 2.8 g/dL — ABNORMAL LOW (ref 3.5–5.0)
Alkaline Phosphatase: 92 U/L (ref 38–126)
Anion gap: 7 (ref 5–15)
BUN: 7 mg/dL (ref 6–20)
CO2: 23 mmol/L (ref 22–32)
Calcium: 9.2 mg/dL (ref 8.9–10.3)
Chloride: 106 mmol/L (ref 98–111)
Creatinine, Ser: 0.64 mg/dL (ref 0.44–1.00)
GFR, Estimated: >60 mL/min (ref >60)
Glucose, Bld: 100 mg/dL — ABNORMAL HIGH (ref 70–99)
Potassium: 4 mmol/L (ref 3.5–5.1)
Sodium: 136 mmol/L (ref 135–145)
Total Bilirubin: 0.5 mg/dL (ref 0.3–1.2)
Total Protein: 6.3 g/dL — ABNORMAL LOW (ref 6.5–8.1)

## 2021-01-26 LAB — PROTEIN / CREATININE RATIO, URINE
Creatinine, Urine: 46 mg/dL
Protein Creatinine Ratio: 0.28 mg/mg{Cre} — ABNORMAL HIGH (ref 0.00–0.15)
Total Protein, Urine: 13 mg/dL

## 2021-01-26 LAB — RPR: RPR Ser Ql: NONREACTIVE

## 2021-01-26 MED ORDER — DIPHENHYDRAMINE HCL 25 MG PO CAPS: 25.0000 mg | ORAL_CAPSULE | Freq: Four times a day (QID) | ORAL | Status: DC | PRN

## 2021-01-26 MED ORDER — DIBUCAINE (PERIANAL) 1 % EX OINT: 1.0000 "application " | TOPICAL_OINTMENT | CUTANEOUS | Status: DC | PRN

## 2021-01-26 MED ORDER — NIFEDIPINE ER OSMOTIC RELEASE 30 MG PO TB24
30.0000 mg | ORAL_TABLET | Freq: Every day | ORAL | Status: DC
  Administered 2021-01-26 – 2021-01-27 (×2): 30 mg via ORAL
  Filled 2021-01-26 (×2): qty 1

## 2021-01-26 MED ORDER — ACETAMINOPHEN 325 MG PO TABS
650.0000 mg | ORAL_TABLET | ORAL | Status: DC | PRN
  Administered 2021-01-26 – 2021-01-27 (×5): 650 mg via ORAL
  Filled 2021-01-26 (×5): qty 2

## 2021-01-26 MED ORDER — OXYCODONE HCL 5 MG PO TABS: 10.0000 mg | ORAL_TABLET | ORAL | Status: DC | PRN

## 2021-01-26 MED ORDER — SENNOSIDES-DOCUSATE SODIUM 8.6-50 MG PO TABS
2.0000 | ORAL_TABLET | ORAL | Status: DC
  Administered 2021-01-26 – 2021-01-27 (×2): 2 via ORAL
  Filled 2021-01-26 (×2): qty 2

## 2021-01-26 MED ORDER — ONDANSETRON HCL 4 MG/2ML IJ SOLN: 4.0000 mg | INTRAMUSCULAR | Status: DC | PRN

## 2021-01-26 MED ORDER — ONDANSETRON HCL 4 MG PO TABS: 4.0000 mg | ORAL_TABLET | ORAL | Status: DC | PRN

## 2021-01-26 MED ORDER — COCONUT OIL OIL: 1.0000 "application " | TOPICAL_OIL | Status: DC | PRN

## 2021-01-26 MED ORDER — WITCH HAZEL-GLYCERIN EX PADS: 1.0000 "application " | MEDICATED_PAD | CUTANEOUS | Status: DC | PRN

## 2021-01-26 MED ORDER — OXYCODONE HCL 5 MG PO TABS: 5.0000 mg | ORAL_TABLET | ORAL | Status: DC | PRN

## 2021-01-26 MED ORDER — SIMETHICONE 80 MG PO CHEW: 80.0000 mg | CHEWABLE_TABLET | ORAL | Status: DC | PRN

## 2021-01-26 MED ORDER — PRENATAL MULTIVITAMIN CH
1.0000 | ORAL_TABLET | Freq: Every day | ORAL | Status: DC
  Administered 2021-01-26: 1 via ORAL
  Filled 2021-01-26: qty 1

## 2021-01-26 MED ORDER — BENZOCAINE-MENTHOL 20-0.5 % EX AERO: 1.0000 "application " | INHALATION_SPRAY | CUTANEOUS | Status: DC | PRN

## 2021-01-26 NOTE — Progress Notes (Signed)
° °  Subjective:  Comfortable, epidural in place  Objective:   Vitals: Blood pressure 128/74, pulse 89, temperature 98.4 F (36.9 C), temperature source Oral, resp. rate 18, height 5\' 6"  (1.676 m), weight 108 kg, last menstrual period 05/10/2020, SpO2 94 %. Temp:  [98.1 F (36.7 C)-98.5 F (36.9 C)] 98.4 F (36.9 C) (01/20 2209) Pulse Rate:  [66-98] 89 (01/20 2214) Resp:  [16-18] 18 (01/20 1913) BP: (128-147)/(74-92) 128/74 (01/20 2214) SpO2:  [94 %-99 %] 94 % (01/20 2210) Weight:  12-21-1988 kg] 108 kg (01/20 0850)  General: NAD Abdomen:gravid, non-tender Cervical Exam:  Dilation: 4 Effacement (%): 60 Cervical Position: Anterior Station: -1, -2 Presentation: Vertex Exam by:: Mairlyn Tegtmeyer MD IUPC placed  FHT: 125, moderate, +accels, no decels Toco:q2-24min  Results for orders placed or performed during the hospital encounter of 01/25/21 (from the past 24 hour(s))  CBC     Status: Abnormal   Collection Time: 01/25/21  9:13 AM  Result Value Ref Range   WBC 8.2 4.0 - 10.5 K/uL   RBC 3.51 (L) 3.87 - 5.11 MIL/uL   Hemoglobin 10.6 (L) 12.0 - 15.0 g/dL   HCT 01/27/21 (L) 02.6 - 37.8 %   MCV 89.2 80.0 - 100.0 fL   MCH 30.2 26.0 - 34.0 pg   MCHC 33.9 30.0 - 36.0 g/dL   RDW 58.8 50.2 - 77.4 %   Platelets 225 150 - 400 K/uL   nRBC 0.0 0.0 - 0.2 %  Type and screen     Status: None   Collection Time: 01/25/21  9:13 AM  Result Value Ref Range   ABO/RH(D) O NEG    Antibody Screen POS    Sample Expiration 01/28/2021,2359    Antibody Identification      PASSIVELY ACQUIRED ANTI-D Performed at [facility], [address] [place], [place]     Assessment:   31 y.o. G2P1001 [redacted]w[redacted]d IOL GHTN  Plan:   1) Labor - continue to titrate pitocin as needed  2) Fetus - cat I tracing  3) GHTN - asymptomatic normotensive to mild range BP  [redacted]w[redacted]d, MD, [facility], [facility] 01/26/2021, 1:03 AM

## 2021-01-26 NOTE — Discharge Summary (Signed)
OB Discharge Summary     Patient Name: Courtney Johnston DOB: 1990/01/29 MRN: [id number]  Date of admission: 01/25/2021 Delivering MD: Vena Austria   Date of discharge: 01/27/2021  Admitting diagnosis: Gestational hypertension, third trimester [O13.3] Intrauterine pregnancy: [redacted]w[redacted]d     Secondary diagnosis:  Principal Problem:   Gestational hypertension, third trimester  Additional problems: none     Discharge diagnosis: Term Pregnancy Delivered                                                                                                Post partum procedures:rhogam  Augmentation: AROM, Pitocin, and Cytotec  Complications: None  Hospital course:  Induction of Labor With Vaginal Delivery   31 y.o. yo G2P1001 at [redacted]w[redacted]d was admitted to the hospital 01/25/2021 for induction of labor.  Indication for induction: Gestational hypertension.  Patient had an uncomplicated labor course as follows: Membrane Rupture Time/Date: 8:08 PM ,01/25/2021   Delivery Method:Vaginal, Spontaneous  Episiotomy: None  Lacerations:  None  Details of delivery can be found in separate delivery note.  Patient had a routine postpartum course. Patient is discharged home 01/27/21.  Newborn Data: Birth date:01/26/2021  Birth time:2:18 AM  Gender:Female  Living status:Living  Apgars:9 ,9  Weight:   Physical exam  Vitals:   01/26/21 1615 01/26/21 1943 01/26/21 2303 01/27/21 0433  BP: 131/77 111/68 132/78 123/79  Pulse: 80 93 93 83  Resp: 18 18 18 18   Temp: 98 F (36.7 C) 98.7 F (37.1 C) 97.7 F (36.5 C) 98.5 F (36.9 C)  TempSrc: Oral Oral Oral Oral  SpO2:  97% 98% 98%  Weight:      Height:       General: alert, cooperative, and no distress Lochia: appropriate Uterine Fundus: firm Incision: N/A DVT Evaluation: No evidence of DVT seen on physical exam. Labs: Lab Results  Component Value Date   WBC 8.8 01/27/2021   HGB 9.7 (L) 01/27/2021   HCT 29.1 (L) 01/27/2021   MCV 90.1  01/27/2021   PLT 204 01/27/2021   CMP Latest Ref Rng & Units 01/26/2021  Glucose 70 - 99 mg/dL 01/28/2021)  BUN 6 - 20 mg/dL 7  Creatinine 409(W - 1.19 mg/dL 1.47  Sodium 8.29 - 562 mmol/L 136  Potassium 3.5 - 5.1 mmol/L 4.0  Chloride 98 - 111 mmol/L 106  CO2 22 - 32 mmol/L 23  Calcium 8.9 - 10.3 mg/dL 9.2  Total Protein 6.5 - 8.1 g/dL 6.3(L)  Total Bilirubin 0.3 - 1.2 mg/dL 0.5  Alkaline Phos 38 - 126 U/L 92  AST 15 - 41 U/L 24  ALT 0 - 44 U/L 12    Discharge instruction: per After Visit Summary and "Baby and Me Booklet".  After visit meds:  Allergies as of 01/27/2021   No Known Allergies      Medication List     STOP taking these medications    aspirin 81 MG chewable tablet   ferrous sulfate 325 (65 FE) MG EC tablet   Prenatal 28-0.8 MG Tabs   prenatal multivitamin Tabs tablet  TAKE these medications    acetaminophen 500 MG tablet Commonly known as: TYLENOL Take 1 tablet (500 mg total) by mouth every 6 (six) hours as needed.   NIFEdipine 30 MG 24 hr tablet Commonly known as: ADALAT CC Take 1 tablet (30 mg total) by mouth daily.               Discharge Care Instructions  (From admission, onward)           Start     Ordered   01/27/21 0000  Discharge wound care:       Comments: SHOWER DAILY Wash incision gently with soap and water.  Call office with any drainage, redness, or firmness of the incision.   01/27/21 0848            Diet: routine diet  Activity: Advance as tolerated. Pelvic rest for 6 weeks.   Outpatient follow up:1 week Follow up Appt: Future Appointments  Date Time Provider Department Center  01/30/2021  1:15 PM Mirna Mires, CNM [facility] None   Follow up Visit:No follow-ups on file.  Postpartum contraception: Not Discussed  Newborn Data: Live born adult  Birth Weight:   APGAR: ,   Newborn Delivery   Birth date/time:  Delivery type:       Baby Feeding: Breast Disposition:home with  mother   01/27/2021 Natale Milch, MD

## 2021-01-26 NOTE — Progress Notes (Signed)
Subjective:  She is feeling well tired from delivery.  Pain control is adequate. Voiding without difficulty. Tolerating a regular diet. Ambulating well.  Objective:   Blood pressure (!) 140/94, pulse 86, temperature 98.5 F (36.9 C), temperature source Oral, resp. rate 20, height 5\' 6"  (1.676 m), weight 108 kg, last menstrual period 05/10/2020, SpO2 99 %, unknown if currently breastfeeding.  General: NAD Pulmonary: no increased work of breathing Abdomen: non-distended, non-tender Uterus:  fundus firm at U; lochia normal Extremities: no edema, no erythema, no tenderness, no signs of DVT  Results for orders placed or performed during the hospital encounter of 01/25/21 (from the past 72 hour(s))  CBC     Status: Abnormal   Collection Time: 01/25/21  9:13 AM  Result Value Ref Range   WBC 8.2 4.0 - 10.5 K/uL   RBC 3.51 (L) 3.87 - 5.11 MIL/uL   Hemoglobin 10.6 (L) 12.0 - 15.0 g/dL   HCT 01/27/21 (L) 51.7 - 00.1 %   MCV 89.2 80.0 - 100.0 fL   MCH 30.2 26.0 - 34.0 pg   MCHC 33.9 30.0 - 36.0 g/dL   RDW 74.9 44.9 - 67.5 %   Platelets 225 150 - 400 K/uL   nRBC 0.0 0.0 - 0.2 %    Comment: Performed at [facility], [address] [place], [place]  Type and screen     Status: None   Collection Time: 01/25/21  9:13 AM  Result Value Ref Range   ABO/RH(D) O NEG    Antibody Screen POS    Sample Expiration 01/28/2021,2359    Antibody Identification      PASSIVELY ACQUIRED ANTI-D Performed at [facility], [address] [place], [place]   RPR     Status: None   Collection Time: 01/25/21  9:13 AM  Result Value Ref Range   RPR Ser Ql NON REACTIVE NON REACTIVE    Comment: Performed at [facility], [address] [place], [place]  Comprehensive metabolic panel     Status: Abnormal   Collection Time: 01/26/21 11:11 AM  Result Value Ref Range   Sodium 136 135 - 145 mmol/L   Potassium 4.0 3.5 - 5.1 mmol/L   Chloride 106 98 - 111 mmol/L    CO2 23 22 - 32 mmol/L   Glucose, Bld 100 (H) 70 - 99 mg/dL    Comment: Glucose reference range applies only to samples taken after fasting for at least 8 hours.   BUN 7 6 - 20 mg/dL   Creatinine, Ser 01/28/21 0.44 - 1.00 mg/dL   Calcium 9.2 8.9 - 9.39 mg/dL   Total Protein 6.3 (L) 6.5 - 8.1 g/dL   Albumin 2.8 (L) 3.5 - 5.0 g/dL   AST 24 15 - 41 U/L   ALT 12 0 - 44 U/L   Alkaline Phosphatase 92 38 - 126 U/L   Total Bilirubin 0.5 0.3 - 1.2 mg/dL   GFR, Estimated 03.0 >09 mL/min    Comment: (NOTE) Calculated using the CKD-EPI Creatinine Equation (2021)    Anion gap 7 5 - 15    Comment: Performed at [facility], [address] [place], [place]    Assessment:   31 y.o. [phone] postpartum day # 0  Plan:   1) Acute blood loss anemia - hemodynamically stable and asymptomatic - po ferrous sulfate  2) Blood Type --/--/O NEG (01/20 0913)   3) Rubella 1.16 (06/29 1544) / Varicella Immune  4) TDAP status - up to date Immunization History  Administered Date(s) Administered   Influenza,inj,Quad PF,6+ Mos 10/11/2015, 09/09/2017, 09/07/2020   Td 08/15/2004   Tdap 07/04/2020    5) Feeding- breast   6) Contraception - not discussed  7) GHTN- will start procardia 30 mg XL. Labs for Pre-E.   8) Disposition - continue care  Adelene Idler MD [facility], [facility] 01/26/2021 11:49 AM

## 2021-01-27 LAB — FETAL SCREEN: Fetal Screen: NEGATIVE

## 2021-01-27 LAB — CBC
HCT: 29.1 % — ABNORMAL LOW (ref 36.0–46.0)
Hemoglobin: 9.7 g/dL — ABNORMAL LOW (ref 12.0–15.0)
MCH: 30 pg (ref 26.0–34.0)
MCHC: 33.3 g/dL (ref 30.0–36.0)
MCV: 90.1 fL (ref 80.0–100.0)
Platelets: 204 K/uL (ref 150–400)
RBC: 3.23 MIL/uL — ABNORMAL LOW (ref 3.87–5.11)
RDW: 14.5 % (ref 11.5–15.5)
WBC: 8.8 K/uL (ref 4.0–10.5)
nRBC: 0 % (ref 0.0–0.2)

## 2021-01-27 MED ORDER — NIFEDIPINE ER 30 MG PO TB24: 30.0000 mg | ORAL_TABLET | Freq: Every day | ORAL | 1 refills | Status: DC

## 2021-01-27 MED ORDER — RHO D IMMUNE GLOBULIN 1500 UNIT/2ML IJ SOSY
300.0000 ug | PREFILLED_SYRINGE | Freq: Once | INTRAMUSCULAR | Status: AC
  Administered 2021-01-27: 300 ug via INTRAVENOUS
  Filled 2021-01-27: qty 2

## 2021-01-27 MED ORDER — ACETAMINOPHEN 500 MG PO TABS: 500.0000 mg | ORAL_TABLET | Freq: Four times a day (QID) | ORAL | 0 refills | Status: DC | PRN

## 2021-01-27 NOTE — Anesthesia Postprocedure Evaluation (Signed)
Anesthesia Post Note  Patient: Courtney Johnston  Procedure(s) Performed: AN AD HOC LABOR EPIDURAL  Patient location during evaluation: Mother Baby Anesthesia Type: Epidural Level of consciousness: awake and alert Pain management: pain level controlled Vital Signs Assessment: post-procedure vital signs reviewed and stable Respiratory status: spontaneous breathing, nonlabored ventilation and respiratory function stable Cardiovascular status: stable Postop Assessment: no headache, no backache, no apparent nausea or vomiting and able to ambulate Anesthetic complications: no   No notable events documented.   Last Vitals:  Vitals:   01/27/21 0916 01/27/21 1142  BP: 128/85 123/78  Pulse: 99 98  Resp: 20 18  Temp: 36.8 C 36.7 C  SpO2: 97%     Last Pain:  Vitals:   01/27/21 1142  TempSrc: Oral  PainSc:                  [name]

## 2021-01-27 NOTE — Progress Notes (Signed)
Pt discharged with infant.  Discharge instructions, prescriptions and follow up appointment given to and reviewed with pt. Pt verbalized understanding. Escorted out by auxillary. 

## 2021-01-27 NOTE — Anesthesia Post-op Follow-up Note (Signed)
°  Anesthesia Pain Follow-up Note  Patient: Courtney Johnston  Day #: 1  Date of Follow-up: 01/27/2021 Time: 12:24 PM  Last Vitals:  Vitals:   01/27/21 0916 01/27/21 1142  BP: 128/85 123/78  Pulse: 99 98  Resp: 20 18  Temp: 36.8 C 36.7 C  SpO2: 97%     Level of Consciousness: alert  Pain: mild   Side Effects:None  Catheter Site Exam:clean, dry     Plan: D/C from anesthesia care at surgeon's request  Martha Clan

## 2021-01-27 NOTE — Progress Notes (Signed)
Subjective:  She is feeling well. Was able to breast feed successfully last night. Pain control is adequate. Voiding without difficulty. Tolerating a regular diet. Ambulating well.  Objective:   Blood pressure 123/79, pulse 83, temperature 98.5 F (36.9 C), temperature source Oral, resp. rate 18, height 5\' 6"  (1.676 m), weight 108 kg, last menstrual period 05/10/2020, SpO2 98 %, unknown if currently breastfeeding.  General: NAD Pulmonary: no increased work of breathing Abdomen: non-distended, non-tender Uterus:  fundus firm at U; lochia normal Extremities: no edema, no erythema, no tenderness, no signs of DVT  Results for orders placed or performed during the hospital encounter of 01/25/21 (from the past 72 hour(s))  CBC     Status: Abnormal   Collection Time: 01/25/21  9:13 AM  Result Value Ref Range   WBC 8.2 4.0 - 10.5 K/uL   RBC 3.51 (L) 3.87 - 5.11 MIL/uL   Hemoglobin 10.6 (L) 12.0 - 15.0 g/dL   HCT 01/27/21 (L) 65.9 - 93.5 %   MCV 89.2 80.0 - 100.0 fL   MCH 30.2 26.0 - 34.0 pg   MCHC 33.9 30.0 - 36.0 g/dL   RDW 70.1 77.9 - 39.0 %   Platelets 225 150 - 400 K/uL   nRBC 0.0 0.0 - 0.2 %    Comment: Performed at [facility], [address] [place], [place]  Type and screen     Status: None   Collection Time: 01/25/21  9:13 AM  Result Value Ref Range   ABO/RH(D) O NEG    Antibody Screen POS    Sample Expiration 01/28/2021,2359    Antibody Identification      PASSIVELY ACQUIRED ANTI-D Performed at [facility], [address] [place], [place]   RPR     Status: None   Collection Time: 01/25/21  9:13 AM  Result Value Ref Range   RPR Ser Ql NON REACTIVE NON REACTIVE    Comment: Performed at [facility], [address] [place], [place]  Comprehensive metabolic panel     Status: Abnormal   Collection Time: 01/26/21 11:11 AM  Result Value Ref Range   Sodium 136 135 - 145 mmol/L   Potassium 4.0 3.5 - 5.1 mmol/L    Chloride 106 98 - 111 mmol/L   CO2 23 22 - 32 mmol/L   Glucose, Bld 100 (H) 70 - 99 mg/dL    Comment: Glucose reference range applies only to samples taken after fasting for at least 8 hours.   BUN 7 6 - 20 mg/dL   Creatinine, Ser 01/28/21 0.44 - 1.00 mg/dL   Calcium 9.2 8.9 - 4.56 mg/dL   Total Protein 6.3 (L) 6.5 - 8.1 g/dL   Albumin 2.8 (L) 3.5 - 5.0 g/dL   AST 24 15 - 41 U/L   ALT 12 0 - 44 U/L   Alkaline Phosphatase 92 38 - 126 U/L   Total Bilirubin 0.5 0.3 - 1.2 mg/dL   GFR, Estimated 25.6 >38 mL/min    Comment: (NOTE) Calculated using the CKD-EPI Creatinine Equation (2021)    Anion gap 7 5 - 15    Comment: Performed at [facility], [address] [place], [place]  Protein / creatinine ratio, urine     Status: Abnormal   Collection Time: 01/26/21  2:36 PM  Result Value Ref Range   Creatinine, Urine 46 mg/dL   Total Protein, Urine 13 mg/dL    Comment: NO NORMAL RANGE ESTABLISHED  FOR THIS TEST   Protein Creatinine Ratio 0.28 (H) 0.00 - 0.15 mg/mg[Cre]    Comment: Performed at [facility], [address] [place], [place] [address]  Fetal screen     Status: None   Collection Time: 01/27/21  5:38 AM  Result Value Ref Range   Fetal Screen      NEG Performed at [facility], [address] [place], [place] [address]   Rhogam injection     Status: None (Preliminary result)   Collection Time: 01/27/21  5:38 AM  Result Value Ref Range   Unit Number [id number]    Blood Component Type RHIG    Unit division 00    Status of Unit ALLOCATED    Transfusion Status OK TO TRANSFUSE   CBC     Status: Abnormal   Collection Time: 01/27/21  5:38 AM  Result Value Ref Range   WBC 8.8 4.0 - 10.5 K/uL   RBC 3.23 (L) 3.87 - 5.11 MIL/uL   Hemoglobin 9.7 (L) 12.0 - 15.0 g/dL   HCT 55.7 (L) 32.2 - 02.5 %   MCV 90.1 80.0 - 100.0 fL   MCH 30.0 26.0 - 34.0 pg   MCHC 33.3 30.0 - 36.0 g/dL   RDW 42.7 06.2 - 37.6 %   Platelets 204 150 - 400 K/uL    nRBC 0.0 0.0 - 0.2 %    Comment: Performed at [facility], [address], [place], [place] [address]    Assessment:   31 y.o. [phone] postpartum day # 1  Plan:   1) Acute blood loss anemia - hemodynamically stable and asymptomatic - po ferrous sulfate  2) Blood Type --/--/O NEG (01/20 0913) - rho gam to be given today  3) Rubella 1.16 (06/29 1544) / Varicella Immune  4) TDAP status - up to date Immunization History  Administered Date(s) Administered   Influenza,inj,Quad PF,6+ Mos 10/11/2015, 09/09/2017, 09/07/2020   Td 08/15/2004   Tdap 07/04/2020    5) Feeding- breast   6) Contraception - not discussed  7) GHTN- BP well controlled with Procardia, will continue. Patient reports she will be able to check her BP at home.  8) Disposition - home today  Courtney Idler MD [facility], [facility] 01/27/2021 8:43 AM

## 2021-01-28 LAB — RHOGAM INJECTION: Unit division: 0

## 2021-01-30 ENCOUNTER — Encounter: Payer: Medicaid Other | Admitting: Obstetrics

## 2021-02-01 ENCOUNTER — Other Ambulatory Visit: Payer: Self-pay

## 2021-02-01 ENCOUNTER — Ambulatory Visit (INDEPENDENT_AMBULATORY_CARE_PROVIDER_SITE_OTHER): Payer: Medicaid Other | Admitting: Obstetrics & Gynecology

## 2021-02-01 ENCOUNTER — Encounter: Payer: Self-pay | Admitting: Obstetrics & Gynecology

## 2021-02-01 VITALS — BP 150/90 | Ht 66.0 in | Wt 217.0 lb

## 2021-02-01 DIAGNOSIS — O165 Unspecified maternal hypertension, complicating the puerperium: Secondary | ICD-10-CM | POA: Diagnosis not present

## 2021-02-01 NOTE — Progress Notes (Signed)
Obstetrics & Gynecology Office Visit   Chief Complaint  Patient presents with   Blood Pressure Check   History of Present Illness: 31 y.o. [id number] being seen for follow up blood pressure check today.  The patient is  postpartum 1 week, with dx of gest HTN reason for IOL at 37 weeks w successful NSVD.  She was not on medicine for BP before pregnancy nor during pregnany.  Was started on Procardia after delivery.   She reports no current symptoms attributable to her blood pressure.  Medication list reviewed medications which may contribute to BP elevation were not noted.  No edema.  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  Obstetric History: [id number]  Family History:  Family History  Problem Relation Age of Onset   Arthritis Mother    Heart disease Mother    Hypertension Mother    Asthma Father    Early death Father        Drowning   Hypertension Maternal Aunt    Heart disease Maternal Uncle    Hypertension Maternal Uncle    Heart disease Maternal Grandmother    Hypertension Maternal Grandmother    Arthritis Maternal Grandfather    Hypertension Maternal Grandfather    Diabetes Paternal Grandfather     Social History:  Social History   Socioeconomic History   Marital status: Single    Spouse name: Benard Halsted   Number of children: 0   Years of education: 12   Highest education level: Not on file  Occupational History   Occupation: Caregiver  Tobacco Use   Smoking status: Never   Smokeless tobacco: Never  Vaping Use   Vaping Use: Never used  Substance and Sexual  Activity   Alcohol use: Not Currently    Comment: occasionally.    Drug use: No   Sexual activity: Yes    Birth control/protection: I.U.D.  Other Topics Concern   Not on file  Social History Narrative   Hobbies: Reading. She enjoys spending time with family   Caffeine: 1 cup of coffee daily   Exercise: once a week. Walking on treadmill   Social Determinants of Health   Financial Resource Strain: Not on file  Food Insecurity: Not on file  Transportation Needs: Not on file  Physical Activity: Not on file  Stress: Not on file  Social Connections: Not on file  Intimate Partner Violence: Not on file    Allergies:  No Known Allergies  Medications: Prior to Admission medications   Medication Sig Start Date End Date Taking? Authorizing Provider  acetaminophen (TYLENOL) 500 MG tablet Take 1 tablet (500 mg total) by mouth every 6 (six) hours as needed. 01/27/21  Yes Schuman, Christanna R, MD  NIFEdipine (ADALAT CC) 30 MG 24 hr tablet Take 1 tablet (30 mg total) by mouth daily. 01/27/21  Yes Schuman, Stefanie Libel, MD    Review of Systems  Constitutional:  Negative for chills, fever and malaise/fatigue.  HENT:  Negative for congestion, sinus pain and sore throat.  Eyes:  Negative for blurred vision and pain.  Respiratory:  Negative for cough and wheezing.   Cardiovascular:  Negative for chest pain and leg swelling.  Gastrointestinal:  Negative for abdominal pain, constipation, diarrhea, heartburn, nausea and vomiting.  Genitourinary:  Negative for dysuria, frequency, hematuria and urgency.  Musculoskeletal:  Negative for back pain, joint pain, myalgias and neck pain.  Skin:  Negative for itching and rash.  Neurological:  Negative for dizziness, tremors and weakness.  Endo/Heme/Allergies:  Does not bruise/bleed easily.  Psychiatric/Behavioral:  Negative for depression. The patient is not nervous/anxious and does not have insomnia.    Physical Exam Blood pressure (!) 150/90, height  5\' 6"  (1.676 m), weight 217 lb (98.4 kg), currently breastfeeding.  General: NAD HEENT: normocephalic, anicteric Pulmonary: No increased work of breathing Cardiovascular: RRR, distal pulses 2+ Extremities: noedema, no erythema, no tenderness Neurologic: Grossly intact Psychiatric: mood appropriate, affect full  Assessment: 31 y.o. [id number] presenting for blood pressure evaluation today  Plan: Problem List Items Addressed This Visit     Hypertension, postpartum condition or complication    -  Primary     1) Blood pressure - blood pressure at today's visit is elevated.  Hpwever all of her home BP's are normal and she reports no symptoms. As a result  will continue w current dose of Procardia XL 30 mg daily . - additional blood work was not obtained  Breast feeding going well Plans Mirena IUD No s/sx PPD  Barnett Applebaum, MD, [facility], [facility] 02/01/2021  10:49 AM

## 2021-03-08 ENCOUNTER — Ambulatory Visit (INDEPENDENT_AMBULATORY_CARE_PROVIDER_SITE_OTHER): Payer: Medicaid Other | Admitting: Obstetrics

## 2021-03-08 ENCOUNTER — Other Ambulatory Visit: Payer: Self-pay

## 2021-03-08 ENCOUNTER — Encounter: Payer: Self-pay | Admitting: Obstetrics

## 2021-03-08 VITALS — BP 118/78 | Ht 66.0 in | Wt 208.0 lb

## 2021-03-08 DIAGNOSIS — Z30011 Encounter for initial prescription of contraceptive pills: Secondary | ICD-10-CM

## 2021-03-08 MED ORDER — NORETHINDRONE 0.35 MG PO TABS
1.0000 | ORAL_TABLET | Freq: Every day | ORAL | 3 refills | Status: DC
Start: 2021-03-08 — End: 2021-03-25

## 2021-03-08 NOTE — Progress Notes (Signed)
?Postpartum Visit  ?Chief Complaint:  ?Chief Complaint  ?Patient presents with  ? Postpartum Care  ? ? ?History of Present Illness: Patient is a 31 y.o. [id number] presents for postpartum visit. ? ?Date of delivery: 01/26/2021 ?Type of delivery: Vaginal delivery - Vacuum or forceps assisted  no ?Episiotomy No.  ?Laceration: no  ?Pregnancy or labor problems:  no ?Any problems since the delivery:  no ? ?Newborn Details:  ?SINGLETON :  ?1. Baby's name: girl. Birth weight: unknown ?Maternal Details:  ?Breast Feeding:  yes ?Post partum depression/anxiety noted:  no ?Edinburgh Post-Partum Depression Score:  7  ?Date of last PAP: 2021  normal  ? ?Past Medical History:  ?Diagnosis Date  ? Anemia   ? Chronic kidney disease   ? History of chicken pox   ? 31yrs old  ? ? ?Past Surgical History:  ?Procedure Laterality Date  ? KIDNEY SURGERY Right 1990  ? 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 ? ?Prior to Admission medications   ?Medication Sig Start Date End Date Taking? Authorizing Provider  ?acetaminophen (TYLENOL) 500 MG tablet Take 1 tablet (500 mg total) by mouth every 6 (six) hours as needed. 01/27/21  Yes Schuman, Christanna R, MD  ?NIFEdipine (ADALAT CC) 30 MG 24 hr tablet Take 1 tablet (30 mg total) by mouth daily. 01/27/21  Yes Schuman, Jaquelyn Bitter, MD  ?norethindrone (ORTHO MICRONOR) 0.35 MG tablet Take 1 tablet (0.35 mg total) by mouth daily. 03/08/21  Yes Mirna Mires, CNM  ? ? ?No Known Allergies  ? ?Social History  ? ?Socioeconomic History  ? Marital status: Single  ?  Spouse name: Letitia Libra  ? Number of children: 0  ? Years of education: 29  ? Highest education level: Not on file  ?Occupational History  ? Occupation: Caregiver  ?Tobacco Use  ? Smoking status: Never  ? Smokeless tobacco: Never  ?Vaping Use  ? Vaping Use: Never used  ?Substance and Sexual Activity  ? Alcohol use: Not Currently  ?  Comment: occasionally.   ? Drug use: No  ? Sexual activity: Yes   ?  Birth control/protection: I.U.D.  ?Other Topics Concern  ? Not on file  ?Social History Narrative  ? Hobbies: Reading. She enjoys spending time with family  ? Caffeine: 1 cup of coffee daily  ? Exercise: once a week. Walking on treadmill  ? ?Social Determinants of Health  ? ?Financial Resource Strain: Not on file  ?Food Insecurity: Not on file  ?Transportation Needs: Not on file  ?Physical Activity: Not on file  ?Stress: Not on file  ?Social Connections: Not on file  ?Intimate Partner Violence: Not on file  ? ? ?Family History  ?Problem Relation Age of Onset  ? Arthritis Mother   ? Heart disease Mother   ? Hypertension Mother   ? Asthma Father   ? Early death Father   ?     Drowning  ? Hypertension Maternal Aunt   ? Heart disease Maternal Uncle   ? Hypertension Maternal Uncle   ? Heart disease Maternal Grandmother   ? Hypertension Maternal Grandmother   ? Arthritis Maternal Grandfather   ? Hypertension Maternal Grandfather   ? Diabetes Paternal Grandfather   ? ? ?Review of Systems  ?Constitutional: Negative.   ?HENT: Negative.    ?Eyes: Negative.   ?Respiratory: Negative.    ?Cardiovascular: Negative.   ?Gastrointestinal: Negative.   ?Genitourinary: Negative.   ?Musculoskeletal: Negative.   ?  Skin: Negative.   ?Neurological: Negative.   ?Endo/Heme/Allergies: Negative.   ?Psychiatric/Behavioral: Negative.     ? ?Physical Exam ?BP 118/78   Ht 5\' 6"  (1.676 m)   Wt 208 lb (94.3 kg)   Breastfeeding Yes   BMI 33.57 kg/m?   ?Physical Exam ?Constitutional:   ?   Appearance: Normal appearance. She is obese.  ?Genitourinary:  ?   Vulva and rectum normal.  ?   Genitourinary Comments: Uterus is involuted, non enlarged, no CMT, anteverted.  ?HENT:  ?   Head: Normocephalic and atraumatic.  ?Cardiovascular:  ?   Rate and Rhythm: Normal rate and regular rhythm.  ?Pulmonary:  ?   Effort: Pulmonary effort is normal.  ?   Breath sounds: Normal breath sounds.  ?Abdominal:  ?   Palpations: Abdomen is soft.  ?Musculoskeletal:      ?   General: Normal range of motion.  ?   Cervical back: Normal range of motion and neck supple.  ?Neurological:  ?   Mental Status: She is alert.  ?Skin: ?   General: Skin is warm and dry.  ?Psychiatric:     ?   Mood and Affect: Mood normal.     ?   Behavior: Behavior normal.  ?  ? ?Female Chaperone present during breast and/or pelvic exam. ? ?Assessment: 31 y.o. 32 presenting for 6 week postpartum visit ? ?Plan: ?Problem List Items Addressed This Visit   ?None ?Visit Diagnoses   ? ? Encounter for initial prescription of contraceptive pills    -  Primary  ? Relevant Medications  ? norethindrone (ORTHO MICRONOR) 0.35 MG tablet  ? Postpartum care following vaginal delivery      ? ?  ? ? ? ?1) Contraception Education given regarding options for contraception, including oral contraceptives.she prefers POPS, and her husband is planning a vasectomy ? ?2)  Pap ?- ASCCP guidelines and rational discussed.  Patient opts for q 3 years screening interval ? ?3) Patient underwent screening for postpartum depression with no concerns noted. ? ?4) Follow up 1 year for routine annual exam ? ?[id number], CNM  ?03/08/2021 11:37 AM  ? ?03/08/2021 11:36 AM    ?

## 2021-03-25 ENCOUNTER — Other Ambulatory Visit: Payer: Self-pay | Admitting: Advanced Practice Midwife

## 2021-03-25 ENCOUNTER — Telehealth: Payer: Self-pay

## 2021-03-25 DIAGNOSIS — Z3041 Encounter for surveillance of contraceptive pills: Secondary | ICD-10-CM

## 2021-03-25 MED ORDER — NORGESTIMATE-ETH ESTRADIOL 0.25-35 MG-MCG PO TABS: 1.0000 | ORAL_TABLET | Freq: Every day | ORAL | 3 refills | Status: DC

## 2021-03-25 NOTE — Telephone Encounter (Signed)
Left detailed msg - 'Erskine Squibb has called in a rx for you" ?

## 2021-03-25 NOTE — Telephone Encounter (Signed)
Pt calling; has completely stopped breastfeeding; needs regular bcp called in.  [phone] ?

## 2021-03-25 NOTE — Progress Notes (Signed)
Rx OCP (sprintec) sent per patient request. She is 2 months postpartum and no longer breastfeeding.  ?

## 2021-06-18 ENCOUNTER — Ambulatory Visit
Admission: RE | Admit: 2021-06-18 | Discharge: 2021-06-18 | Disposition: A | Discharge status: Home / Self Care | Payer: Medicaid Other | Source: Ambulatory Visit | Attending: Emergency Medicine | Admitting: Emergency Medicine

## 2021-06-18 ENCOUNTER — Ambulatory Visit (INDEPENDENT_AMBULATORY_CARE_PROVIDER_SITE_OTHER): Payer: Medicaid Other

## 2021-06-18 VITALS — BP 160/101 | HR 86 | Temp 98.0°F | Resp 18

## 2021-06-18 DIAGNOSIS — R030 Elevated blood-pressure reading, without diagnosis of hypertension: Secondary | ICD-10-CM

## 2021-06-18 DIAGNOSIS — S60011A Contusion of right thumb without damage to nail, initial encounter: Secondary | ICD-10-CM

## 2021-06-18 DIAGNOSIS — M79644 Pain in right finger(s): Secondary | ICD-10-CM

## 2021-06-18 NOTE — Discharge Instructions (Addendum)
The xray of your thumb is negative.  Take Tylenol or ibuprofen as needed.  Rest and elevate your hand.  Apply ice packs 2-3 times a day for up to 20 minutes each.  Follow up with an orthopedist if your symptoms are not improving.    Your blood pressure is elevated today at 166/109; repeat 165/111 and 160/101.  Please have this rechecked by your primary care provider in 1-2 weeks.
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
## 2022-01-29 ENCOUNTER — Other Ambulatory Visit: Payer: Self-pay | Admitting: Advanced Practice Midwife

## 2022-01-29 DIAGNOSIS — Z3041 Encounter for surveillance of contraceptive pills: Secondary | ICD-10-CM

## 2022-04-22 ENCOUNTER — Other Ambulatory Visit: Payer: Self-pay | Admitting: Advanced Practice Midwife

## 2022-04-22 DIAGNOSIS — Z3041 Encounter for surveillance of contraceptive pills: Secondary | ICD-10-CM

## 2022-04-24 ENCOUNTER — Other Ambulatory Visit: Payer: Self-pay | Admitting: Advanced Practice Midwife

## 2022-04-24 ENCOUNTER — Other Ambulatory Visit: Payer: Self-pay

## 2022-04-24 DIAGNOSIS — Z3041 Encounter for surveillance of contraceptive pills: Secondary | ICD-10-CM

## 2022-04-24 MED ORDER — NORGESTIMATE-ETH ESTRADIOL 0.25-35 MG-MCG PO TABS: 1.0000 | ORAL_TABLET | Freq: Every day | ORAL | 0 refills | Status: DC

## 2022-04-24 NOTE — Telephone Encounter (Signed)
Pt calling for refill of bc; annual scheduled 5/23rd.  [phone]  Pt aware refill eRx'd.to [facility].

## 2022-05-29 ENCOUNTER — Ambulatory Visit (INDEPENDENT_AMBULATORY_CARE_PROVIDER_SITE_OTHER): Payer: Medicaid Other | Admitting: Advanced Practice Midwife

## 2022-05-29 ENCOUNTER — Encounter: Payer: Self-pay | Admitting: Advanced Practice Midwife

## 2022-05-29 ENCOUNTER — Telehealth: Payer: Self-pay | Admitting: Family

## 2022-05-29 VITALS — BP 162/106 | HR 80 | Ht 66.0 in | Wt 209.7 lb

## 2022-05-29 DIAGNOSIS — Z0000 Encounter for general adult medical examination without abnormal findings: Secondary | ICD-10-CM

## 2022-05-29 DIAGNOSIS — R030 Elevated blood-pressure reading, without diagnosis of hypertension: Secondary | ICD-10-CM

## 2022-05-29 DIAGNOSIS — Z01419 Encounter for gynecological examination (general) (routine) without abnormal findings: Secondary | ICD-10-CM

## 2022-05-29 DIAGNOSIS — Z3041 Encounter for surveillance of contraceptive pills: Secondary | ICD-10-CM

## 2022-05-29 MED ORDER — NIFEDIPINE ER 30 MG PO TB24: 30.0000 mg | ORAL_TABLET | Freq: Every day | ORAL | 1 refills | Status: DC

## 2022-05-29 MED ORDER — NORGESTIMATE-ETH ESTRADIOL 0.25-35 MG-MCG PO TABS: 1.0000 | ORAL_TABLET | Freq: Every day | ORAL | 4 refills | Status: DC

## 2022-05-29 NOTE — Telephone Encounter (Signed)
Pt called in staying that she went to a Dr. Isidore Moos this morning and her bp was high 162/106. When she got home, she said it was 132/81, And she was calling because they told her to f/u with her PCP. They also gave her meds for it, but she's not sure to take it or not. I transferred her to access nurse.

## 2022-05-29 NOTE — Progress Notes (Signed)
Courtney Johnston  Gynecology Annual Exam   PCP: Allegra Grana, FNP  Chief Complaint:  Chief Complaint  Patient presents with   Annual Exam    History of Present Illness: Patient is a 32 y.o. G2P2002 presents for annual exam. The patient has no Johnston complaints today. She has gained some weight since last visit.   Of note, she has 2 elevated blood pressure readings in the office today. She did have gestational hypertension in her last pregnancy and was on procardia. She discontinued the medication as her blood pressure regulated to normal limits. She reports usually normal readings on home cuff. She admits limited exercise and increased sweets in her diet. She does drink water and get adequate sleep. Discussed the importance of maintaining a healthy blood pressure and suggested she follow up with her PCP regarding. Will send Rx for Procardia 30 XL and she will check BP at home and follow up with PCP.  LMP: Patient's last menstrual period was 05/15/2022 (exact date). Average Interval: regular, 28 days Duration of flow:  5-6  days Heavy Menses: no Clots: no Intermenstrual Bleeding: no Postcoital Bleeding: no Dysmenorrhea: no  The patient is sexually active. She currently uses OCP (estrogen/progesterone) for contraception. She denies dyspareunia.  The patient does perform self breast exams.  There is no notable family history of breast or ovarian cancer in her family.  The patient wears seatbelts: yes.   The patient denies current symptoms of depression.    Review of Systems: Review of Systems  Constitutional:  Negative for chills and fever.  HENT:  Negative for congestion, ear discharge, ear pain, hearing loss, sinus pain and sore throat.   Eyes:  Negative for blurred vision and double vision.  Respiratory:  Negative for cough, shortness of breath and wheezing.   Cardiovascular:  Negative for chest pain, palpitations and leg swelling.  Gastrointestinal:  Negative for abdominal  pain, blood in stool, constipation, diarrhea, heartburn, melena, nausea and vomiting.  Genitourinary:  Negative for dysuria, flank pain, frequency, hematuria and urgency.  Musculoskeletal:  Negative for back pain, joint pain and myalgias.  Skin:  Negative for itching and rash.  Neurological:  Negative for dizziness, tingling, tremors, sensory change, speech change, focal weakness, seizures, loss of consciousness, weakness and headaches.  Endo/Heme/Allergies:  Negative for environmental allergies. Does not bruise/bleed easily.  Psychiatric/Behavioral:  Negative for depression, hallucinations, memory loss, substance abuse and suicidal ideas. The patient is not nervous/anxious and does not have insomnia.     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 Contraception: OCP (estrogen/progesterone) Last Pap: 2021 Results were: no abnormalities   Obstetric History: [id number]  Family History:  Family History  Problem Relation Age of Onset   Arthritis Mother    Heart disease Mother    Hypertension Mother    Asthma Father    Early death Father        Drowning   Hypertension Maternal Aunt    Heart disease Maternal Uncle    Hypertension Maternal Uncle    Heart disease Maternal Grandmother  Hypertension Maternal Grandmother    Arthritis Maternal Grandfather    Hypertension Maternal Grandfather    Diabetes Paternal Grandfather     Social History:  Social History   Socioeconomic History   Marital status: Single    Spouse name: Letitia Libra   Number of children:  0   Years of education: 12   Highest education level: Not on file  Occupational History   Occupation: Caregiver  Tobacco Use   Smoking status: Never   Smokeless tobacco: Never  Vaping Use   Vaping Use: Never used  Substance and Sexual Activity   Alcohol use: Not Currently    Comment: occasionally.    Drug use: No   Sexual activity: Yes    Birth control/protection: I.U.D.  Other Topics Concern   Not on file  Social History Narrative   Hobbies: Reading. She enjoys spending time with family   Caffeine: 1 cup of coffee daily   Exercise: once a week. Walking on treadmill   Social Determinants of Health   Financial Resource Strain: Not on file  Food Insecurity: Not on file  Transportation Needs: Not on file  Physical Activity: Not on file  Stress: Not on file  Social Connections: Not on file  Intimate Partner Violence: Not on file    Allergies:  No Known Allergies  Medications: Prior to Admission medications   Medication Sig Start Date End Date Taking? Authorizing Provider  acetaminophen (TYLENOL) 500 MG tablet Take 1 tablet (500 mg total) by mouth every 6 (six) hours as needed. 01/27/21   Schuman, Jaquelyn Bitter, MD  NIFEdipine (ADALAT CC) 30 MG 24 hr tablet Take 1 tablet (30 mg total) by mouth daily. 05/29/22   Tresea Mall, CNM  norgestimate-ethinyl estradiol (MONO-LINYAH) 0.25-35 MG-MCG tablet Take 1 tablet by mouth daily. 05/29/22   Tresea Mall, CNM    Physical Exam Vitals: Blood pressure (!) 162/106, pulse 80, height 5\' 6"  (1.676 m), weight 209 lb 11.2 oz (95.1 kg), last menstrual period 05/15/2022  General: NAD HEENT: normocephalic, anicteric Thyroid: no enlargement, no palpable nodules Pulmonary: No increased work of breathing, CTAB Cardiovascular: RRR, distal pulses 2+ Breast: Breast symmetrical, no tenderness, no palpable nodules or masses, no skin or nipple retraction present, no nipple discharge.  No axillary or supraclavicular lymphadenopathy. Abdomen:  NABS, soft, non-tender, non-distended.  Umbilicus without lesions.  No hepatomegaly, splenomegaly or masses palpable. No evidence of hernia  Genitourinary: deferred for no concerns/PAP interval Extremities: no edema, erythema, or tenderness Neurologic: Grossly intact Psychiatric: mood appropriate, affect full   Assessment: 32 y.o. G2P2002 routine annual exam  Plan: Problem List Items Addressed This Visit   None Visit Diagnoses     Well woman exam without gynecological exam    -  Primary   Elevated blood pressure reading in office without diagnosis of hypertension       Relevant Medications   NIFEdipine (ADALAT CC) 30 MG 24 hr tablet   Encounter for surveillance of contraceptive pills       Relevant Medications   norgestimate-ethinyl estradiol (MONO-LINYAH) 0.25-35 MG-MCG tablet       1) STI screening  was offered and declined  2)  ASCCP guidelines and rationale discussed.  Patient opts for every 5 years screening interval. Due in 2026  3) Contraception - the patient is currently using  OCP (estrogen/progesterone).  She is happy with her current form of contraception and plans to continue  4) Routine healthcare maintenance including cholesterol, diabetes screening discussed Declines  5) Increase healthy  lifestyle; diet, exercise, adequate hydration  6) Elevated blood pressure: Rx procardia for short time until she sees PCP  7) Return in about 1 year (around 05/29/2023) for annual established Johnston.   Tresea Mall, CNM [facility] 05/29/2022 9:52 AM

## 2022-05-29 NOTE — Patient Instructions (Signed)
Managing Your Hypertension Hypertension, also called high blood pressure, is when the force of the blood pressing against the walls of the arteries is too strong. Arteries are blood vessels that carry blood from your heart throughout your body. Hypertension forces the heart to work harder to pump blood and may cause the arteries to become narrow or stiff. Understanding blood pressure readings A blood pressure reading includes a higher number over a lower number: The first, or top, number is called the systolic pressure. It is a measure of the pressure in your arteries as your heart beats. The second, or bottom number, is called the diastolic pressure. It is a measure of the pressure in your arteries as the heart relaxes. For most people, a normal blood pressure is below 120/80. Your personal target blood pressure may vary depending on your medical conditions, your age, and other factors. Blood pressure is classified into four stages. Based on your blood pressure reading, your health care provider may use the following stages to determine what type of treatment you need, if any. Systolic pressure and diastolic pressure are measured in a unit called millimeters of mercury (mmHg). Normal Systolic pressure: below 120. Diastolic pressure: below 80. Elevated Systolic pressure: 120-129. Diastolic pressure: below 80. Hypertension stage 1 Systolic pressure: 130-139. Diastolic pressure: 80-89. Hypertension stage 2 Systolic pressure: 140 or above. Diastolic pressure: 90 or above. How can this condition affect me? Managing your hypertension is very important. Over time, hypertension can damage the arteries and decrease blood flow to parts of the body, including the brain, heart, and kidneys. Having untreated or uncontrolled hypertension can lead to: A heart attack. A stroke. A weakened blood vessel (aneurysm). Heart failure. Kidney damage. Eye damage. Memory and concentration problems. Vascular  dementia. What actions can I take to manage this condition? Hypertension can be managed by making lifestyle changes and possibly by taking medicines. Your health care provider will help you make a plan to bring your blood pressure within a normal range. You may be referred for counseling on a healthy diet and physical activity. Nutrition  Eat a diet that is high in fiber and potassium, and low in salt (sodium), added sugar, and fat. An example eating plan is called the DASH diet. DASH stands for Dietary Approaches to Stop Hypertension. To eat this way: Eat plenty of fresh fruits and vegetables. Try to fill one-half of your plate at each meal with fruits and vegetables. Eat whole grains, such as whole-wheat pasta, brown rice, or whole-grain bread. Fill about one-fourth of your plate with whole grains. Eat low-fat dairy products. Avoid fatty cuts of meat, processed or cured meats, and poultry with skin. Fill about one-fourth of your plate with lean proteins such as fish, chicken without skin, beans, eggs, and tofu. Avoid pre-made and processed foods. These tend to be higher in sodium, added sugar, and fat. Reduce your daily sodium intake. Many people with hypertension should eat less than 1,500 mg of sodium a day. Lifestyle  Work with your health care provider to maintain a healthy body weight or to lose weight. Ask what an ideal weight is for you. Get at least 30 minutes of exercise that causes your heart to beat faster (aerobic exercise) most days of the week. Activities may include walking, swimming, or biking. Include exercise to strengthen your muscles (resistance exercise), such as weight lifting, as part of your weekly exercise routine. Try to do these types of exercises for 30 minutes at least 3 days a week. Do  not use any products that contain nicotine or tobacco. These products include cigarettes, chewing tobacco, and vaping devices, such as e-cigarettes. If you need help quitting, ask your  health care provider. Control any long-term (chronic) conditions you have, such as high cholesterol or diabetes. Identify your sources of stress and find ways to manage stress. This may include meditation, deep breathing, or making time for fun activities. Alcohol use Do not drink alcohol if: Your health care provider tells you not to drink. You are pregnant, may be pregnant, or are planning to become pregnant. If you drink alcohol: Limit how much you have to: 0-1 drink a day for women. 0-2 drinks a day for men. Know how much alcohol is in your drink. In the [place] one drink equals one 12 oz bottle of beer (355 mL), one 5 oz glass of wine (148 mL), or one 1 oz glass of hard liquor (44 mL). Medicines Your health care provider may prescribe medicine if lifestyle changes are not enough to get your blood pressure under control and if: Your systolic blood pressure is 130 or higher. Your diastolic blood pressure is 80 or higher. Take medicines only as told by your health care provider. Follow the directions carefully. Blood pressure medicines must be taken as told by your health care provider. The medicine does not work as well when you skip doses. Skipping doses also puts you at risk for problems. Monitoring Before you monitor your blood pressure: Do not smoke, drink caffeinated beverages, or exercise within 30 minutes before taking a measurement. Use the bathroom and empty your bladder (urinate). Sit quietly for at least 5 minutes before taking measurements. Monitor your blood pressure at home as told by your health care provider. To do this: Sit with your back straight and supported. Place your feet flat on the floor. Do not cross your legs. Support your arm on a flat surface, such as a table. Make sure your upper arm is at heart level. Each time you measure, take two or three readings one minute apart and record the results. You may also need to have your blood pressure checked regularly by  your health care provider. General information Talk with your health care provider about your diet, exercise habits, and other lifestyle factors that may be contributing to hypertension. Review all the medicines you take with your health care provider because there may be side effects or interactions. Keep all follow-up visits. Your health care provider can help you create and adjust your plan for managing your high blood pressure. Where to find more information National Heart, Lung, and Blood Institute: [employer]is American Heart Association: www.heart.org Contact a health care provider if: You think you are having a reaction to medicines you have taken. You have repeated (recurrent) headaches. You feel dizzy. You have swelling in your ankles. You have trouble with your vision. Get help right away if: You develop a severe headache or confusion. You have unusual weakness or numbness, or you feel faint. You have severe pain in your chest or abdomen. You vomit repeatedly. You have trouble breathing. These symptoms may be an emergency. Get help right away. Call 911. Do not wait to see if the symptoms will go away. Do not drive yourself to the hospital. Summary Hypertension is when the force of blood pumping through your arteries is too strong. If this condition is not controlled, it may put you at risk for serious complications. Your personal target blood pressure may vary depending on your medical conditions,  your age, and other factors. For most people, a normal blood pressure is less than 120/80. Hypertension is managed by lifestyle changes, medicines, or both. Lifestyle changes to help manage hypertension include losing weight, eating a healthy, low-sodium diet, exercising more, stopping smoking, and limiting alcohol. This information is not intended to replace advice given to you by your health care provider. Make sure you discuss any questions you have with your health care  provider. Document Revised: 09/06/2020 Document Reviewed: 09/06/2020 Elsevier Patient Education  2023 [employer] Hypertension, Adult High blood pressure (hypertension) is when the force of blood pumping through the arteries is too strong. The arteries are the blood vessels that carry blood from the heart throughout the body. Hypertension forces the heart to work harder to pump blood and may cause arteries to become narrow or stiff. Untreated or uncontrolled hypertension can lead to a heart attack, heart failure, a stroke, kidney disease, and other problems. A blood pressure reading consists of a higher number over a lower number. Ideally, your blood pressure should be below 120/80. The first ("top") number is called the systolic pressure. It is a measure of the pressure in your arteries as your heart beats. The second ("bottom") number is called the diastolic pressure. It is a measure of the pressure in your arteries as the heart relaxes. What are the causes? The exact cause of this condition is not known. There are some conditions that result in high blood pressure. What increases the risk? Certain factors may make you more likely to develop high blood pressure. Some of these risk factors are under your control, including: Smoking. Not getting enough exercise or physical activity. Being overweight. Having too much fat, sugar, calories, or salt (sodium) in your diet. Drinking too much alcohol. Other risk factors include: Having a personal history of heart disease, diabetes, high cholesterol, or kidney disease. Stress. Having a family history of high blood pressure and high cholesterol. Having obstructive sleep apnea. Age. The risk increases with age. What are the signs or symptoms? High blood pressure may not cause symptoms. Very high blood pressure (hypertensive crisis) may cause: Headache. Fast or irregular heartbeats (palpitations). Shortness of breath. Nosebleed. Nausea and  vomiting. Vision changes. Severe chest pain, dizziness, and seizures. How is this diagnosed? This condition is diagnosed by measuring your blood pressure while you are seated, with your arm resting on a flat surface, your legs uncrossed, and your feet flat on the floor. The cuff of the blood pressure monitor will be placed directly against the skin of your upper arm at the level of your heart. Blood pressure should be measured at least twice using the same arm. Certain conditions can cause a difference in blood pressure between your right and left arms. If you have a high blood pressure reading during one visit or you have normal blood pressure with other risk factors, you may be asked to: Return on a different day to have your blood pressure checked again. Monitor your blood pressure at home for 1 week or longer. If you are diagnosed with hypertension, you may have other blood or imaging tests to help your health care provider understand your overall risk for other conditions. How is this treated? This condition is treated by making healthy lifestyle changes, such as eating healthy foods, exercising more, and reducing your alcohol intake. You may be referred for counseling on a healthy diet and physical activity. Your health care provider may prescribe medicine if lifestyle changes are not enough to get  your blood pressure under control and if: Your systolic blood pressure is above 130. Your diastolic blood pressure is above 80. Your personal target blood pressure may vary depending on your medical conditions, your age, and other factors. Follow these instructions at home: Eating and drinking  Eat a diet that is high in fiber and potassium, and low in sodium, added sugar, and fat. An example of this eating plan is called the DASH diet. DASH stands for Dietary Approaches to Stop Hypertension. To eat this way: Eat plenty of fresh fruits and vegetables. Try to fill one half of your plate at each  meal with fruits and vegetables. Eat whole grains, such as whole-wheat pasta, brown rice, or whole-grain bread. Fill about one fourth of your plate with whole grains. Eat or drink low-fat dairy products, such as skim milk or low-fat yogurt. Avoid fatty cuts of meat, processed or cured meats, and poultry with skin. Fill about one fourth of your plate with lean proteins, such as fish, chicken without skin, beans, eggs, or tofu. Avoid pre-made and processed foods. These tend to be higher in sodium, added sugar, and fat. Reduce your daily sodium intake. Many people with hypertension should eat less than 1,500 mg of sodium a day. Do not drink alcohol if: Your health care provider tells you not to drink. You are pregnant, may be pregnant, or are planning to become pregnant. If you drink alcohol: Limit how much you have to: 0-1 drink a day for women. 0-2 drinks a day for men. Know how much alcohol is in your drink. In the [place] one drink equals one 12 oz bottle of beer (355 mL), one 5 oz glass of wine (148 mL), or one 1 oz glass of hard liquor (44 mL). Lifestyle  Work with your health care provider to maintain a healthy body weight or to lose weight. Ask what an ideal weight is for you. Get at least 30 minutes of exercise that causes your heart to beat faster (aerobic exercise) most days of the week. Activities may include walking, swimming, or biking. Include exercise to strengthen your muscles (resistance exercise), such as Pilates or lifting weights, as part of your weekly exercise routine. Try to do these types of exercises for 30 minutes at least 3 days a week. Do not use any products that contain nicotine or tobacco. These products include cigarettes, chewing tobacco, and vaping devices, such as e-cigarettes. If you need help quitting, ask your health care provider. Monitor your blood pressure at home as told by your health care provider. Keep all follow-up visits. This is  important. Medicines Take over-the-counter and prescription medicines only as told by your health care provider. Follow directions carefully. Blood pressure medicines must be taken as prescribed. Do not skip doses of blood pressure medicine. Doing this puts you at risk for problems and can make the medicine less effective. Ask your health care provider about side effects or reactions to medicines that you should watch for. Contact a health care provider if you: Think you are having a reaction to a medicine you are taking. Have headaches that keep coming back (recurring). Feel dizzy. Have swelling in your ankles. Have trouble with your vision. Get help right away if you: Develop a severe headache or confusion. Have unusual weakness or numbness. Feel faint. Have severe pain in your chest or abdomen. Vomit repeatedly. Have trouble breathing. These symptoms may be an emergency. Get help right away. Call 911. Do not wait to see if the  symptoms will go away. Do not drive yourself to the hospital. Summary Hypertension is when the force of blood pumping through your arteries is too strong. If this condition is not controlled, it may put you at risk for serious complications. Your personal target blood pressure may vary depending on your medical conditions, your age, and other factors. For most people, a normal blood pressure is less than 120/80. Hypertension is treated with lifestyle changes, medicines, or a combination of both. Lifestyle changes include losing weight, eating a healthy, low-sodium diet, exercising more, and limiting alcohol. This information is not intended to replace advice given to you by your health care provider. Make sure you discuss any questions you have with your health care provider. Document Revised: 10/30/2020 Document Reviewed: 10/30/2020 Elsevier Patient Education  2023 [employer].

## 2022-05-30 NOTE — Telephone Encounter (Signed)
Called and spoke with pt.  OB sent in script for Nifedipine but pt has not started medication, she reported that BP at home later that day was 132/81. Encouraged pt to continue taking her BP daily and keep appointment with [facility] 06/04/22.  Asked pt to call office if she has BP greater than 140/90,  headaches or vision changes.

## 2022-05-30 NOTE — Telephone Encounter (Signed)
noted 

## 2022-06-04 ENCOUNTER — Ambulatory Visit (INDEPENDENT_AMBULATORY_CARE_PROVIDER_SITE_OTHER): Payer: Medicaid Other | Admitting: Nurse Practitioner

## 2022-06-04 ENCOUNTER — Encounter: Payer: Self-pay | Admitting: Nurse Practitioner

## 2022-06-04 VITALS — BP 136/92 | HR 88 | Temp 98.1°F | Ht 66.0 in | Wt 209.8 lb

## 2022-06-04 DIAGNOSIS — R030 Elevated blood-pressure reading, without diagnosis of hypertension: Secondary | ICD-10-CM | POA: Diagnosis not present

## 2022-06-04 DIAGNOSIS — I10 Essential (primary) hypertension: Secondary | ICD-10-CM | POA: Insufficient documentation

## 2022-06-04 NOTE — Progress Notes (Signed)
Bethanie Dicker, NP-C Phone: [phone]  Courtney Johnston is a 32 y.o. female who presents today for hypertension.   Patient was seen by her Ob-Gyn for annual exam on 05/29/2022 she was noted to have 2 elevated blood pressure readings in their office (162/106). She had a Hx of gestational hypertension. She was on Procardia at the time and discontinued it after pregnancy due to her blood pressure normalizing. Her Ob-Gyn started her back on Procardia XL 30 mg daily on 05/29/2022. She has not taken the medication. She has been checking her blood pressure at home. Denies headaches. Denies vision changes. Denies weakness.  HYPERTENSION Disease Monitoring Home BP Monitoring- 120-140/70-90  Chest pain- No    Dyspnea- No Medications Compliance-  None. Lightheadedness-  No  Edema- No BMET    Component Value Date/Time   NA 136 01/26/2021 1111   NA 136 01/23/2021 1203   NA 137 08/26/2013 0647   K 4.0 01/26/2021 1111   K 3.4 (L) 08/26/2013 0647   CL 106 01/26/2021 1111   CL 104 08/26/2013 0647   CO2 23 01/26/2021 1111   CO2 26 08/26/2013 0647   GLUCOSE 100 (H) 01/26/2021 1111   GLUCOSE 95 08/26/2013 0647   BUN 7 01/26/2021 1111   BUN 7 01/23/2021 1203   BUN 8 08/26/2013 0647   CREATININE 0.64 01/26/2021 1111   CREATININE 1.07 08/26/2013 0647   CALCIUM 9.2 01/26/2021 1111   CALCIUM 9.0 08/26/2013 0647   GFRNONAA >60 01/26/2021 1111   GFRNONAA >60 08/26/2013 0647   GFRAA >60 05/09/2014 1445   GFRAA >60 08/26/2013 0647     Social History   Tobacco Use  Smoking Status Never  Smokeless Tobacco Never    Current Outpatient Medications on File Prior to Visit  Medication Sig Dispense Refill   norgestimate-ethinyl estradiol (MONO-LINYAH) 0.25-35 MG-MCG tablet Take 1 tablet by mouth daily. 84 tablet 4   No current facility-administered medications on file prior to visit.    ROS see history of present illness  Objective  Physical Exam Vitals:   06/04/22 0813 06/04/22 0824   BP: (!) 140/98 (!) 136/92  Pulse: 88   Temp: 98.1 F (36.7 C)   SpO2: 99%     BP Readings from Last 3 Encounters:  06/04/22 (!) 136/92  05/29/22 (!) 162/106  06/18/21 (!) 160/101   Wt Readings from Last 3 Encounters:  06/04/22 209 lb 12.8 oz (95.2 kg)  05/29/22 209 lb 11.2 oz (95.1 kg)  03/08/21 208 lb (94.3 kg)    Physical Exam Constitutional:      General: She is not in acute distress.    Appearance: Normal appearance.  HENT:     Head: Normocephalic.  Cardiovascular:     Rate and Rhythm: Normal rate and regular rhythm.     Heart sounds: Normal heart sounds.  Pulmonary:     Effort: Pulmonary effort is normal.     Breath sounds: Normal breath sounds.  Skin:    General: Skin is warm and dry.  Neurological:     General: No focal deficit present.     Mental Status: She is alert.  Psychiatric:        Mood and Affect: Mood normal.        Behavior: Behavior normal.    Assessment/Plan: Please see individual problem list.  Elevated blood pressure reading in office without diagnosis of hypertension Assessment & Plan: Patient with elevated blood pressure reading x 2 today in office. She has been checking her blood  pressure occasionally at home with consistently normal/mildly elevated readings. She is asymptomatic. She will continue to check her blood pressure at home daily for 2 weeks and keep a log to bring with her to next appointment. Log provided to patient. Education provided on how to correctly check her blood pressure at home. She will bring her blood pressure cuff to her next appointment. Encouraged to contact if staying consistently above 140/90. Advised to decrease caffeine and sodium intake. Strict precautions given to patient.     Return in about 2 weeks (around 06/18/2022) for Follow up.   Bethanie Dicker, NP-C [facility] [employer]

## 2022-06-04 NOTE — Assessment & Plan Note (Addendum)
Patient with elevated blood pressure reading x 2 today in office. She has been checking her blood pressure occasionally at home with consistently normal/mildly elevated readings. She is asymptomatic. She will continue to check her blood pressure at home daily for 2 weeks and keep a log to bring with her to next appointment. Log provided to patient. Education provided on how to correctly check her blood pressure at home. She will bring her blood pressure cuff to her next appointment. Encouraged to contact if staying consistently above 140/90. Advised to decrease caffeine and sodium intake. Strict precautions given to patient.

## 2022-06-18 ENCOUNTER — Encounter: Payer: Self-pay | Admitting: Family

## 2022-06-18 ENCOUNTER — Ambulatory Visit (INDEPENDENT_AMBULATORY_CARE_PROVIDER_SITE_OTHER): Payer: Medicaid Other | Admitting: Family

## 2022-06-18 VITALS — BP 138/84 | HR 97 | Temp 97.7°F | Ht 66.0 in | Wt 208.2 lb

## 2022-06-18 DIAGNOSIS — Q600 Renal agenesis, unilateral: Secondary | ICD-10-CM | POA: Diagnosis not present

## 2022-06-18 DIAGNOSIS — R030 Elevated blood-pressure reading, without diagnosis of hypertension: Secondary | ICD-10-CM | POA: Diagnosis not present

## 2022-06-18 DIAGNOSIS — N181 Chronic kidney disease, stage 1: Secondary | ICD-10-CM | POA: Diagnosis not present

## 2022-06-18 LAB — COMPREHENSIVE METABOLIC PANEL
ALT: 16 U/L (ref 0–35)
AST: 19 U/L (ref 0–37)
Albumin: 4.5 g/dL (ref 3.5–5.2)
Alkaline Phosphatase: 68 U/L (ref 39–117)
BUN: 11 mg/dL (ref 6–23)
CO2: 26 mEq/L (ref 19–32)
Calcium: 9.5 mg/dL (ref 8.4–10.5)
Chloride: 101 mEq/L (ref 96–112)
Creatinine, Ser: 0.93 mg/dL (ref 0.40–1.20)
GFR: 80.49 mL/min (ref >60.00)
Glucose, Bld: 79 mg/dL (ref 70–99)
Potassium: 4.3 mEq/L (ref 3.5–5.1)
Sodium: 136 mEq/L (ref 135–145)
Total Bilirubin: 0.4 mg/dL (ref 0.2–1.2)
Total Protein: 8.1 g/dL (ref 6.0–8.3)

## 2022-06-18 LAB — CBC WITH DIFFERENTIAL/PLATELET
Basophils Absolute: 0 10*3/uL (ref 0.0–0.1)
Basophils Relative: 0.4 % (ref 0.0–3.0)
Eosinophils Absolute: 0 10*3/uL (ref 0.0–0.7)
Eosinophils Relative: 0.6 % (ref 0.0–5.0)
HCT: 40.5 % (ref 36.0–46.0)
Hemoglobin: 13.4 g/dL (ref 12.0–15.0)
Lymphocytes Relative: 25.9 % (ref 12.0–46.0)
Lymphs Abs: 1.9 10*3/uL (ref 0.7–4.0)
MCHC: 33.2 g/dL (ref 30.0–36.0)
MCV: 85.6 fl (ref 78.0–100.0)
Monocytes Absolute: 0.4 10*3/uL (ref 0.1–1.0)
Monocytes Relative: 5.4 % (ref 3.0–12.0)
Neutro Abs: 4.9 10*3/uL (ref 1.4–7.7)
Neutrophils Relative %: 67.7 % (ref 43.0–77.0)
Platelets: 324 10*3/uL (ref 150.0–400.0)
RBC: 4.73 Mil/uL (ref 3.87–5.11)
RDW: 13.3 % (ref 11.5–15.5)
WBC: 7.3 10*3/uL (ref 4.0–10.5)

## 2022-06-18 LAB — MICROALBUMIN / CREATININE URINE RATIO
Creatinine,U: 22.3 mg/dL
Microalb Creat Ratio: 16.3 mg/g (ref 0.0–30.0)
Microalb, Ur: 3.6 mg/dL — ABNORMAL HIGH (ref 0.0–1.9)

## 2022-06-18 LAB — TSH: TSH: 1.79 u[IU]/mL (ref 0.35–5.50)

## 2022-06-18 NOTE — Assessment & Plan Note (Signed)
Diagnosis of hypertension.  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  Reviewed previous nephrology notes from 2022.  I would consider ACE/ARB for renal protection, hypertension.  Prior to initiating, I would feel most comfortable if patient had a consult and established care with nephrology.  She prefers to establish locally.  I placed a referral.

## 2022-06-18 NOTE — Progress Notes (Signed)
Assessment & Plan:  Elevated blood pressure reading in office without diagnosis of hypertension Assessment & Plan: Diagnosis of hypertension.  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  Reviewed previous nephrology notes from 2022.  I would consider ACE/ARB for renal protection, hypertension.  Prior to initiating, I would feel most comfortable if patient had a consult and established care with nephrology.  She prefers to establish locally.  I placed a referral.  Orders: -     Ambulatory referral to Nephrology -     TSH  Congenital single kidney -     Ambulatory referral to Nephrology  CKD (chronic kidney disease) stage 1, GFR 90 ml/min or greater -     Ambulatory referral to Nephrology -     CBC with Differential/Platelet -     Comprehensive metabolic panel -     Microalbumin / creatinine urine ratio     Return precautions given.   Risks, benefits, and alternatives of the medications and treatment plan prescribed today were discussed, and patient expressed understanding.   Education regarding symptom management and diagnosis given to patient on AVS either electronically or printed.  Return in about 2 months (around 08/18/2022).  Rennie Plowman, FNP  Subjective:    Patient ID: Courtney Johnston, female    DOB: 02/07/1990, 31 y.o.   MRN: [id number]  CC: Courtney Johnston is a 32 y.o. female who presents today for follow up.   HPI: She is here today with blood pressure readings.   She feels well today.  Denies chest pain, shortness of breath, headache   BP readings at home 128/78, 133/80 124/84.   Compliant with birth control; she has no plans to add to her family.     She is following low-sodium diet  Recently seen by colleague, Baird Lyons 06/04/2022 for hypertension.  History of gestational hypertension. She has strong family h/o HTN.  Previously on Procardia  Last seen  by me 09/09/2017  She previously followed with nephrology she has single right kidney. 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   Followed with [facility] nephrology, last seen 11/13/2020 for CKD stage G1, consider ACE/ARB: should not start during pregnancy. Will consider use if appropriate in future.     Allergies: Patient has no known allergies. Current Outpatient Medications on File Prior to Visit  Medication Sig Dispense Refill   norgestimate-ethinyl estradiol (MONO-LINYAH) 0.25-35 MG-MCG tablet Take 1 tablet by mouth daily. 84 tablet 4   No current facility-administered medications on file prior to visit.    Review of Systems  Constitutional:  Negative for chills and fever.  Respiratory:  Negative for cough.   Cardiovascular:  Negative for chest pain and palpitations.  Gastrointestinal:  Negative for nausea and vomiting.      Objective:    BP 138/84   Pulse 97   Temp 97.7 F (36.5 C) (Oral)   Ht 5\' 6"  (1.676 m)   Wt 208 lb 3.2 oz (94.4 kg)   LMP 05/15/2022 (Exact Date)   SpO2 99%   BMI 33.60 kg/m  BP Readings from Last 3 Encounters:  06/18/22 138/84  06/04/22 (!) 136/92  05/29/22 (!) 162/106   Wt Readings from Last 3 Encounters:  06/18/22 208 lb 3.2 oz (94.4 kg)  06/04/22 209 lb 12.8 oz (95.2 kg)  05/29/22 209 lb 11.2 oz (95.1 kg)    Physical Exam Vitals reviewed.  Constitutional:      Appearance: She is well-developed.  Eyes:  Conjunctiva/sclera: Conjunctivae normal.  Cardiovascular:     Rate and Rhythm: Normal rate and regular rhythm.     Pulses: Normal pulses.     Heart sounds: Normal heart sounds.  Pulmonary:     Effort: Pulmonary effort is normal.     Breath sounds: Normal breath sounds. No wheezing, rhonchi or rales.  Skin:    General: Skin is warm and dry.  Neurological:     Mental Status: She is alert.  Psychiatric:        Speech: Speech normal.        Behavior: Behavior normal.        Thought Content: Thought content normal.

## 2022-06-18 NOTE — Patient Instructions (Signed)
I have placed referral to nephrology  Let us know if you dont hear back within a week in regards to an appointment being scheduled.   So that you are aware, if you are [facility] MyChart user , please pay attention to your MyChart messages as you may receive a MyChart message with a phone number to call and schedule this test/appointment own your own from our referral coordinator. This is a new process so I do not want you to miss this message.  If you are not a MyChart user, you will receive a phone call.

## 2022-06-27 IMAGING — US US OB FOLLOW-UP
1 series · 15 of 28 positions shown · non-contrast
Comparison: none

CLINICAL DATA: Pregnancy.  Follow-up fetal anatomy

EXAM:
OBSTETRIC 14+ WK ULTRASOUND FOLLOW-UP

[Series 1: us ob follow up · 15 of 66 slices shown]
[im 1/66]
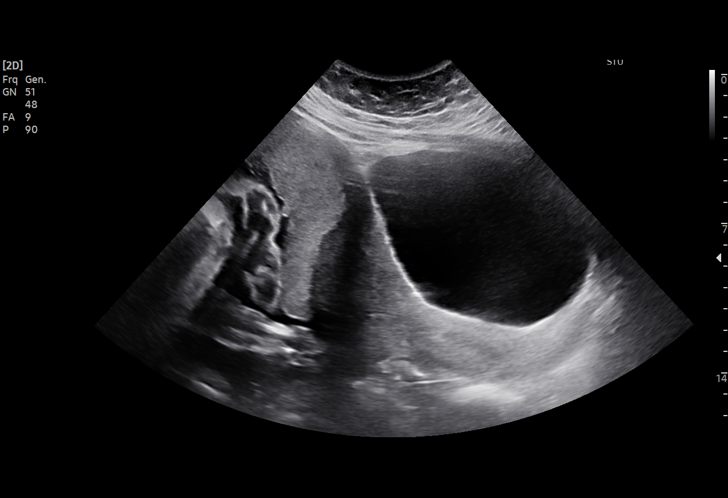
[im 5/66]
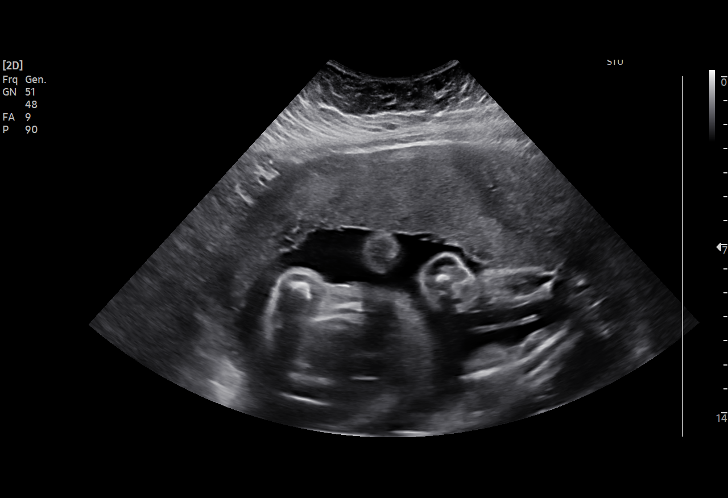
[im 10/66]
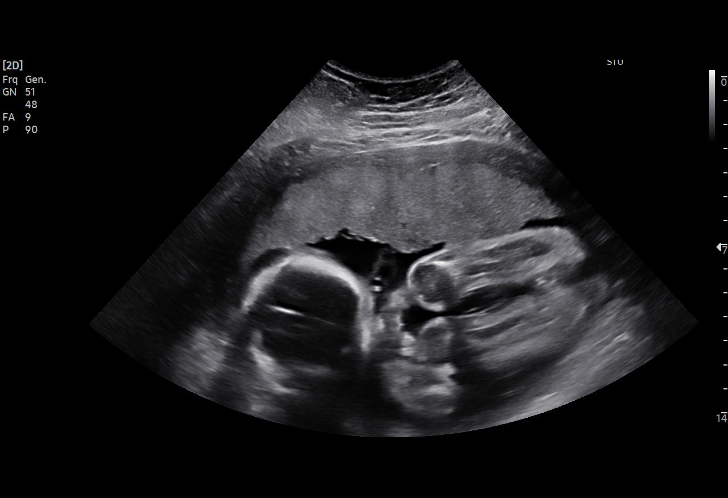
[im 15/66]
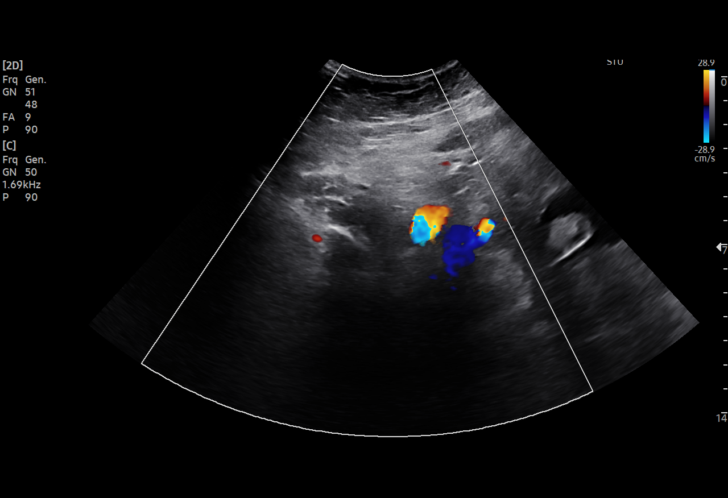
[im 20/66]
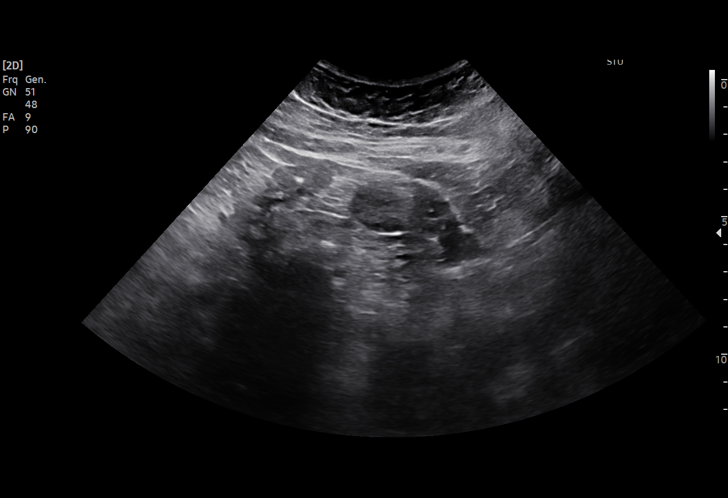
[im 25/66]
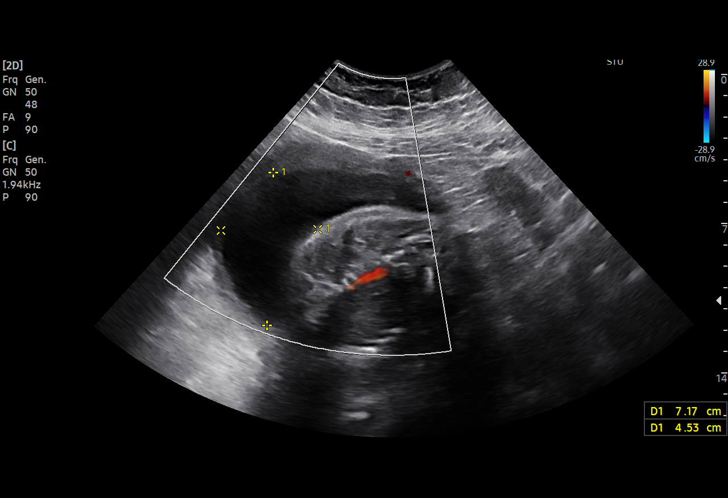
[im 29/66]
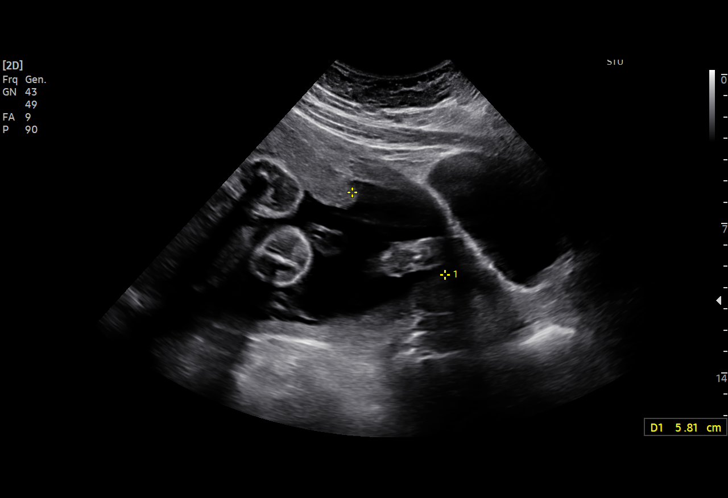
[im 34/66]
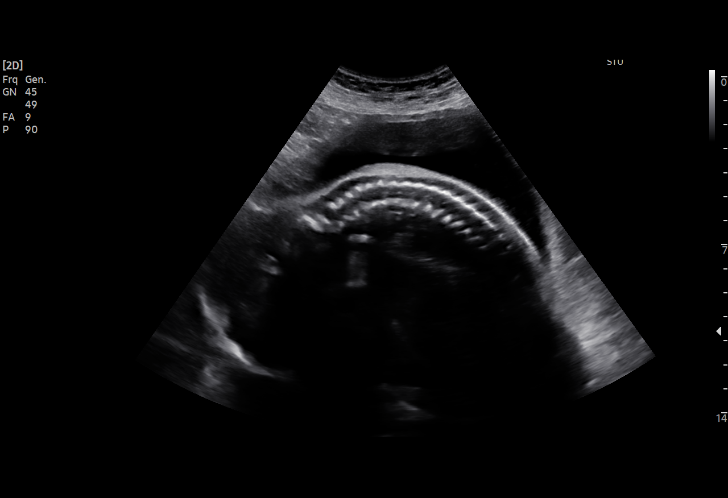
[im 37/66]
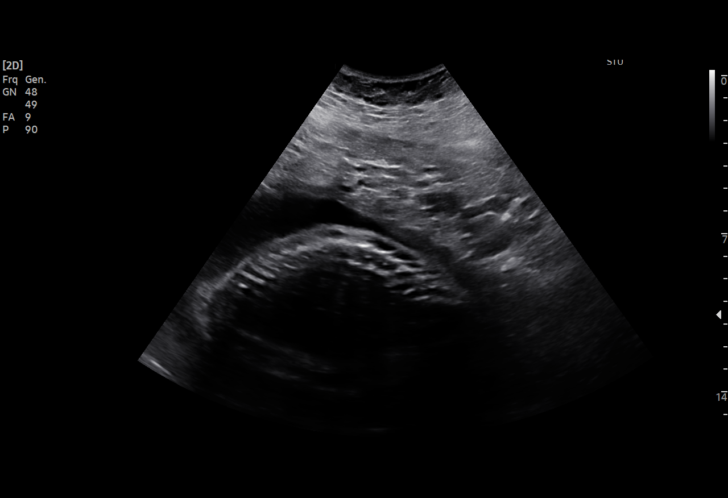
[im 41/66]
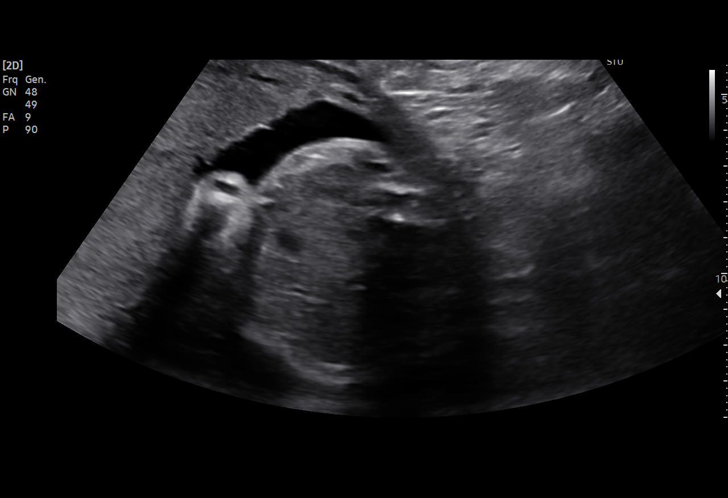
[im 46/66]
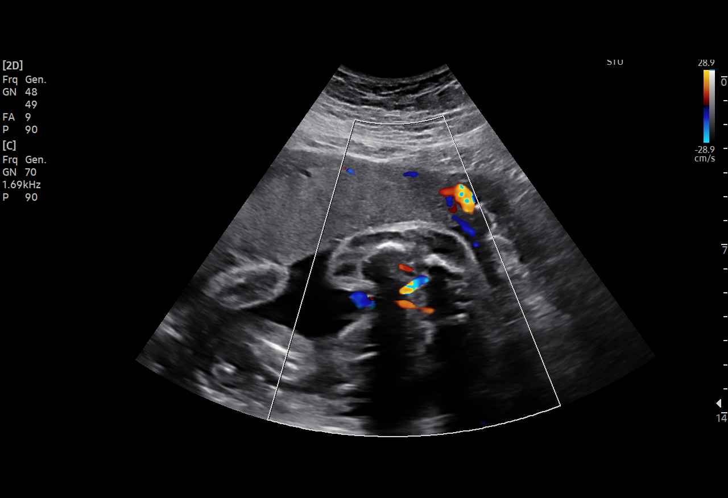
[im 51/66]
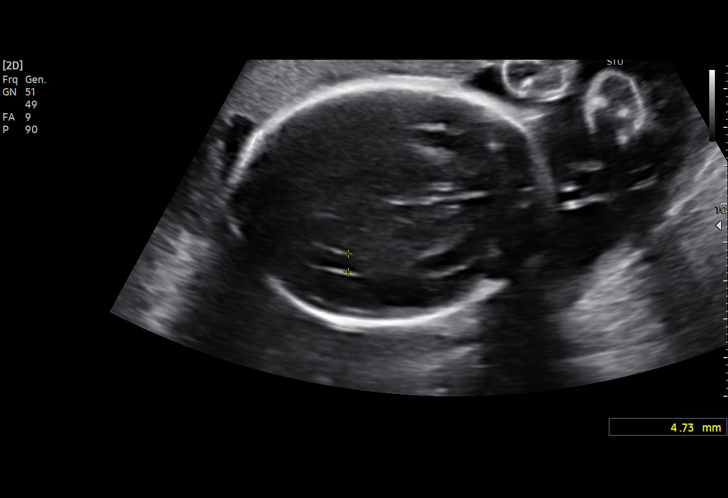
[im 56/66]
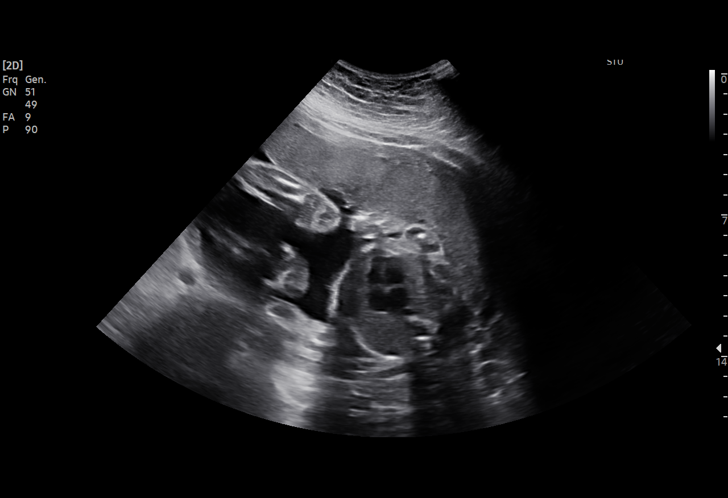
[im 61/66]
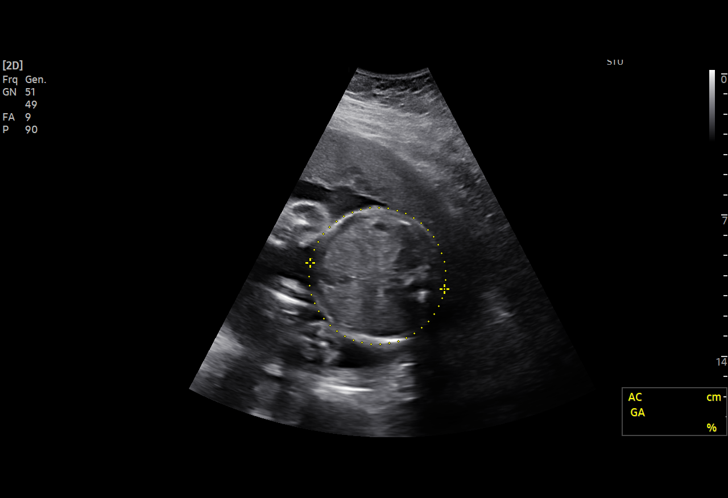
[im 66/66]
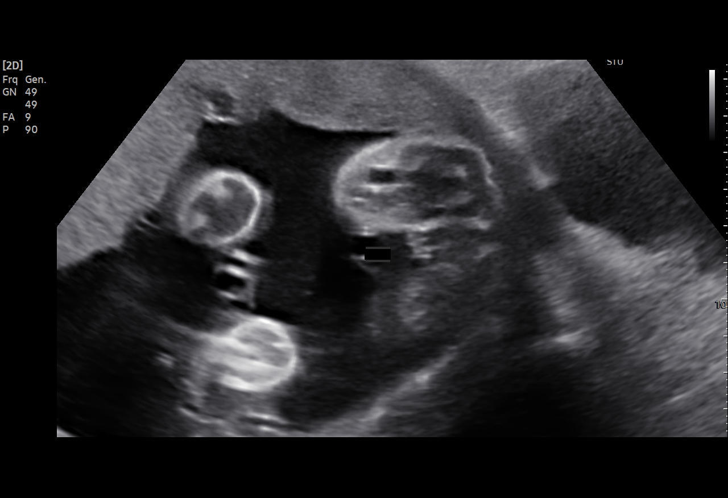

[15 of 28 positions shown; findings below may reference images not displayed]

FINDINGS: Number of Fetuses: 1

Heart Rate:  155 bpm

Movement: Yes

Presentation: Transverse

Previa: No

Placental Location: Anterior

Amniotic Fluid (Subjective): Normal

Amniotic Fluid (Objective):

Vertical pocket 7.2cm

FETAL BIOMETRY

BPD:  6.2cm 25w 2d

HC:    23.2cm 25w 1d

AC:    21.5cm 26w 0d

FL:    4.5cm 25w 0d

Current Mean GA: 25w 1d US EDC: 02/12/2021

Assigned GA: 24w 6d Assigned EDC: 02/14/2021

Estimated Fetal Weight:  820g 60%ile

FETAL ANATOMY

Lateral Ventricles: Previously seen

Thalami/CSP: Previously seen

Posterior Fossa: Previously seen

Nuchal Region: Previously seen

Upper Lip: Previously seen

Spine: Appears normal

4 Chamber Heart on Left: Previously seen

LVOT: Previously seen

RVOT: Previously seen

Stomach on Left: Previously seen

3 Vessel Cord: Previously seen

Cord Insertion site: Previously seen

Kidneys: Previously seen

Bladder: Previously seen

Extremities: Previously seen

Sex: Previously Seen

Technical Limitations: None

Maternal Findings:

Cervix:  Closed.  4.5 cm
IMPRESSION: 1. Single live intrauterine gestation in transverse presentation.
2. Assigned gestational age of 24 weeks 6 days. Adequate interval
growth.
3. Adequate visualization of the fetal spine. Fetal anatomic survey
is now complete.

## 2022-08-06 ENCOUNTER — Encounter (INDEPENDENT_AMBULATORY_CARE_PROVIDER_SITE_OTHER): Payer: Self-pay

## 2022-08-13 NOTE — Progress Notes (Deleted)
   Assessment & Plan:  There are no diagnoses linked to this encounter.   Return precautions given.   Risks, benefits, and alternatives of the medications and treatment plan prescribed today were discussed, and patient expressed understanding.   Education regarding symptom management and diagnosis given to patient on AVS either electronically or printed.  No follow-ups on file.  Rennie Plowman, FNP  Subjective:    Patient ID: Courtney Johnston, female    DOB: Mar 19, 1990, 32 y.o.   MRN: [id number]  CC: [name] is a 32 y.o. female who presents today for follow up.   HPI: Follow-up with nephrology, Dr Suezanne Jacquet, 08/13/22 I advised her to start the nifedipine 30 mg daily as ordered by her primary medical doctor. Also advised her to stay on 2 g salt restricted diet. I advised her to check her blood pressure twice a day.    Allergies: Patient has no known allergies. Current Outpatient Medications on File Prior to Visit  Medication Sig Dispense Refill   norgestimate-ethinyl estradiol (MONO-LINYAH) 0.25-35 MG-MCG tablet Take 1 tablet by mouth daily. 84 tablet 4   No current facility-administered medications on file prior to visit.    Review of Systems    Objective:    There were no vitals taken for this visit. BP Readings from Last 3 Encounters:  06/18/22 138/84  06/04/22 (!) 136/92  05/29/22 (!) 162/106   Wt Readings from Last 3 Encounters:  06/18/22 208 lb 3.2 oz (94.4 kg)  06/04/22 209 lb 12.8 oz (95.2 kg)  05/29/22 209 lb 11.2 oz (95.1 kg)    Physical Exam

## 2022-08-18 ENCOUNTER — Other Ambulatory Visit (INDEPENDENT_AMBULATORY_CARE_PROVIDER_SITE_OTHER): Payer: Self-pay | Admitting: Nephrology

## 2022-08-18 DIAGNOSIS — Q600 Renal agenesis, unilateral: Secondary | ICD-10-CM

## 2022-08-18 DIAGNOSIS — E668 Other obesity: Secondary | ICD-10-CM

## 2022-08-18 DIAGNOSIS — I10 Essential (primary) hypertension: Secondary | ICD-10-CM

## 2022-08-20 ENCOUNTER — Ambulatory Visit: Payer: Medicaid Other | Admitting: Family

## 2022-09-24 ENCOUNTER — Ambulatory Visit (INDEPENDENT_AMBULATORY_CARE_PROVIDER_SITE_OTHER): Payer: Medicaid Other

## 2022-09-24 DIAGNOSIS — E668 Other obesity: Secondary | ICD-10-CM

## 2022-09-24 DIAGNOSIS — I10 Essential (primary) hypertension: Secondary | ICD-10-CM

## 2022-09-24 DIAGNOSIS — Q600 Renal agenesis, unilateral: Secondary | ICD-10-CM

## 2022-10-16 ENCOUNTER — Ambulatory Visit: Payer: Medicaid Other | Admitting: Urology

## 2022-10-16 ENCOUNTER — Encounter: Payer: Self-pay | Admitting: Urology

## 2022-10-16 VITALS — BP 140/80 | HR 70 | Ht 66.0 in | Wt 205.0 lb

## 2022-10-16 DIAGNOSIS — N1330 Unspecified hydronephrosis: Secondary | ICD-10-CM

## 2022-10-16 DIAGNOSIS — Q600 Renal agenesis, unilateral: Secondary | ICD-10-CM

## 2022-10-16 LAB — URINALYSIS, COMPLETE
Bilirubin, UA: NEGATIVE
Glucose, UA: NEGATIVE
Ketones, UA: NEGATIVE
Nitrite, UA: NEGATIVE
Protein,UA: NEGATIVE
Specific Gravity, UA: <1.005 — ABNORMAL LOW (ref 1.005–1.030)
Urobilinogen, Ur: 0.2 mg/dL (ref 0.2–1.0)
pH, UA: 6 (ref 5.0–7.5)

## 2022-10-16 LAB — MICROSCOPIC EXAMINATION: Epithelial Cells (non renal): >10 /[HPF] — AB (ref 0–10)

## 2022-10-16 NOTE — Progress Notes (Signed)
Courtney Johnston,acting as a scribe for Courtney Scotland, MD.,have documented all relevant documentation on the behalf of Courtney Scotland, MD,as directed by  Courtney Scotland, MD while in the presence of Courtney Scotland, MD.  10/16/2022 10:49 AM   Courtney Johnston 08-29-1990 [id number]  Referring provider: Lorain Childes, MD [address] Baldemar Friday [place],  [place] [address]  Chief Complaint  Patient presents with   Hydronephrosis    HPI: 32 year-old female who presents today to establish care for a solitary kidney and right hydronephrosis.   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 Records are not available for that. She has been followed by nephrology in the interim and more recently developed hypertension.   She has not had any recent renal imaging. Her last renal imaging is available via [facility], performed at [facility]. Presumably, she was an established patient since the [birth year] birth. She has had serial renal ultrasounds and Lasix renal studies along with cystograms and RDP's. Her last ultrasound was in 2015 that showed moderate right hydronephrosis, uncharged with voiding. 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   She does have a normal creatinine most recently, 0.93 on 06/18/2022.   Her urinalysis today was contaminated with >10 epithelial cells, 11-30 WBC, 3-10 RBC and many bacteria. She indicated that she struggled to give a sample today. Denies any dysuria or gross hematuria.  No flank pain.     PMH: Past Medical History:  Diagnosis Date   Anemia    Chronic kidney disease    Elevated blood pressure affecting pregnancy in third trimester, antepartum 01/16/2021   History of chicken pox    32yrs old   Labor and delivery, indication for care 10/22/2020    Surgical History: Past Surgical History:  Procedure Laterality Date    KIDNEY SURGERY Right 1990   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 Right kidney surgery for 95% blockage    Home Medications:  Allergies as of 10/16/2022   No Known Allergies      Medication List        Accurate as of October 16, 2022 10:49 AM. If you have any questions, ask your nurse or doctor.          amLODipine 5 MG tablet Commonly known as: NORVASC Take by mouth.   norgestimate-ethinyl estradiol 0.25-35 MG-MCG tablet Commonly known as: Mono-Linyah Take 1 tablet by mouth daily.        Family History: Family History  Problem Relation Age of Onset   Arthritis Mother    Heart disease Mother 29   Hypertension Mother    Heart attack Mother    Asthma Father    Early death Father        Drowning   Heart disease Maternal Grandmother        died in 17's   Hypertension Maternal Grandmother    Heart attack Maternal Grandmother 68   Arthritis Maternal Grandfather    Hypertension Maternal Grandfather    Diabetes Paternal Grandfather    Hypertension Maternal Aunt    Heart disease Maternal Uncle    Hypertension Maternal Uncle     Social History:  reports that she has never smoked. She has never used smokeless tobacco. She reports that she does not currently use alcohol. She reports that she does not use drugs.   Physical Exam: BP (!) 140/80   Pulse  70   Ht 5\' 6"  (1.676 m)   Wt 205 lb (93 kg)   BMI 33.09 kg/m   Constitutional:  Alert and oriented, No acute distress. HEENT: [place] AT, moist mucus membranes.  Trachea midline, no masses. Neurologic: Grossly intact, no focal deficits, moving all 4 extremities. Psychiatric: Normal mood and affect.   Assessment & Plan:    1. Solitary kidney with right hydronephrosis - Referral to [facility] for further evaluation and potential imaging to assess kidney drainage and function.  If she does need repeat Lasix renal study, would likely be better if she has not done in the same facility for direct comparison to  her/historical imaging. -Complex congenital situation, clearly has some sort of degree of ongoing obstruction based on most recent imaging however this is comparable to previous studies.  Overall renal function is preserved.  She is also asymptomatic. -If surgical intervention is indicated based on follow-up imaging studies, she would likely be better served at a tertiary care center given her complex history and previous reconstructive procedures. -If expert opinion indicates no role for intervention, we can continue surveillance locally.  She was advised to let us know the outcome of her consultation and we can arrange follow-up if appropriate.  F/u prn   [facility] [address] [place], [place] [address] [phone]

## 2023-03-19 ENCOUNTER — Encounter: Payer: Self-pay | Admitting: Family

## 2023-05-26 ENCOUNTER — Encounter (INDEPENDENT_AMBULATORY_CARE_PROVIDER_SITE_OTHER): Payer: Self-pay

## 2023-06-15 ENCOUNTER — Other Ambulatory Visit: Payer: Self-pay | Admitting: Advanced Practice Midwife

## 2023-06-15 DIAGNOSIS — Z3041 Encounter for surveillance of contraceptive pills: Secondary | ICD-10-CM

## 2023-06-18 ENCOUNTER — Other Ambulatory Visit: Payer: Self-pay

## 2023-06-18 ENCOUNTER — Other Ambulatory Visit: Payer: Self-pay | Admitting: Advanced Practice Midwife

## 2023-06-18 DIAGNOSIS — Z3041 Encounter for surveillance of contraceptive pills: Secondary | ICD-10-CM

## 2023-06-18 MED ORDER — NORGESTIMATE-ETH ESTRADIOL 0.25-35 MG-MCG PO TABS: 1.0000 | ORAL_TABLET | Freq: Every day | ORAL | 0 refills | Status: DC

## 2023-07-14 ENCOUNTER — Ambulatory Visit: Admitting: Obstetrics and Gynecology

## 2023-07-21 ENCOUNTER — Ambulatory Visit: Admitting: Obstetrics and Gynecology

## 2023-07-21 ENCOUNTER — Encounter: Payer: Self-pay | Admitting: Obstetrics and Gynecology

## 2023-07-21 ENCOUNTER — Other Ambulatory Visit (HOSPITAL_COMMUNITY)
Admission: RE | Admit: 2023-07-21 | Discharge: 2023-07-21 | Disposition: A | Discharge status: Home / Self Care | Source: Ambulatory Visit | Attending: Obstetrics and Gynecology | Admitting: Obstetrics and Gynecology

## 2023-07-21 VITALS — BP 133/84 | HR 69 | Ht 66.0 in | Wt 206.0 lb

## 2023-07-21 DIAGNOSIS — Z3041 Encounter for surveillance of contraceptive pills: Secondary | ICD-10-CM | POA: Diagnosis not present

## 2023-07-21 DIAGNOSIS — I10 Essential (primary) hypertension: Secondary | ICD-10-CM

## 2023-07-21 DIAGNOSIS — Z01419 Encounter for gynecological examination (general) (routine) without abnormal findings: Secondary | ICD-10-CM

## 2023-07-21 DIAGNOSIS — Z1151 Encounter for screening for human papillomavirus (HPV): Secondary | ICD-10-CM | POA: Insufficient documentation

## 2023-07-21 DIAGNOSIS — Z1272 Encounter for screening for malignant neoplasm of vagina: Secondary | ICD-10-CM

## 2023-07-21 DIAGNOSIS — Z124 Encounter for screening for malignant neoplasm of cervix: Secondary | ICD-10-CM | POA: Diagnosis present

## 2023-07-21 MED ORDER — NORETHINDRONE 0.35 MG PO TABS: 1.0000 | ORAL_TABLET | Freq: Every day | ORAL | 3 refills | Status: AC

## 2023-07-21 NOTE — Patient Instructions (Signed)
 I value your feedback and you entrusting [place] with your care. If you get a [place] patient survey, I would appreciate you taking the time to let us know about your experience today. Thank you! ? ? ?

## 2023-07-21 NOTE — Progress Notes (Signed)
 PCP:  Dineen Rollene MATSU, FNP   Chief Complaint  Patient presents with   Gynecologic Exam    No concerns     HPI:      [name] is a 33 y.o. [phone] whose LMP was Patient's last menstrual period was 07/10/2023 (exact date)., presents today for her annual examination.  Her menses are regular every 28-30 days, lasting 5-6 days on OCPs, mod flow, no BTB, decreased dysmen.    Sex activity: single partner, contraception - OCP (estrogen/progesterone). Hx of HTN and CKD stage 1, on amlodipine. No pain/bleeding/dryness. Last Pap: 09/29/19 Results were: no abnormalities /neg HPV DNA ; no hx of abn paps  There is no FH of breast cancer. There is no FH of ovarian cancer. The patient does do self-breast exams.  Tobacco use: The patient denies current or previous tobacco use. Alcohol use: social drinker No drug use.  Exercise: moderately active  She does get adequate calcium but not Vitamin D in her diet. Labs with PCP  Patient Active Problem List   Diagnosis Date Noted   HTN (hypertension) 06/04/2022   CKD (chronic kidney disease) stage 1, GFR 90 ml/min or greater 10/26/2013   Congenital single kidney 09/15/2013    Past Surgical History:  Procedure Laterality Date   KIDNEY SURGERY Right 1990   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 Right kidney surgery for 95% blockage    Family History  Problem Relation Age of Onset   Arthritis Mother    Heart disease Mother 26   Hypertension Mother    Heart attack Mother    Asthma Father    Early death Father        Drowning   Heart disease Maternal Grandmother        died in 66's   Hypertension Maternal Grandmother    Heart attack Maternal Grandmother 64   Arthritis Maternal Grandfather    Hypertension Maternal Grandfather    Diabetes Paternal Grandfather    Hypertension Maternal Aunt    Heart disease Maternal Uncle    Hypertension Maternal Uncle     Social History   Socioeconomic History    Marital status: Single    Spouse name: Garnette Jaeger   Number of children: 0   Years of education: 12   Highest education level: Some college, no degree  Occupational History   Occupation: Caregiver  Tobacco Use   Smoking status: Never   Smokeless tobacco: Never  Vaping Use   Vaping status: Never Used  Substance and Sexual Activity   Alcohol use: Yes    Comment: occasionally.    Drug use: No   Sexual activity: Yes    Birth control/protection: Pill  Other Topics Concern   Not on file  Social History Narrative   Hobbies: Crafting with wooden signs,Reading. She enjoys spending time with family      Caffeine: 1 cup of coffee dailyExercise: once a week. Walking on treadmill         Stays at home with children      76 year old boy   1.5 girl      Social Drivers of Corporate investment banker Strain: Low Risk  (05/31/2022)   Overall Financial Resource Strain (CARDIA)    Difficulty of Paying Living Expenses: Not very hard  Food Insecurity: No Food Insecurity (05/31/2022)   Hunger Vital Sign    Worried About Running Out of Food in the Last Year: Never true  Ran Out of Food in the Last Year: Never true  Transportation Needs: No Transportation Needs (05/31/2022)   PRAPARE - Administrator, Civil Service (Medical): No    Lack of Transportation (Non-Medical): No  Physical Activity: Insufficiently Active (05/31/2022)   Exercise Vital Sign    Days of Exercise per Week: 2 days    Minutes of Exercise per Session: 10 min  Stress: No Stress Concern Present (05/31/2022)   [employer] of Occupational Health - Occupational Stress Questionnaire    Feeling of Stress : Not at all  Social Connections: Moderately Integrated (05/31/2022)   Social Connection and Isolation Panel    Frequency of Communication with Friends and Family: More than three times a week    Frequency of Social Gatherings with Friends and Family: Once a week    Attends Religious Services: 1 to 4 times per  year    Active Member of [employer] or Organizations: No    Attends Engineer, structural: Not on file    Marital Status: Living with partner  Intimate Partner Violence: Not on file     Current Outpatient Medications:    acetaminophen  (TYLENOL ) 500 MG tablet, Take by mouth., Disp: , Rfl:    amLODipine (NORVASC) 5 MG tablet, Take by mouth., Disp: , Rfl:    norethindrone  (MICRONOR ) 0.35 MG tablet, Take 1 tablet (0.35 mg total) by mouth daily., Disp: 84 tablet, Rfl: 3     ROS:  Review of Systems  Constitutional:  Negative for fatigue, fever and unexpected weight change.  Respiratory:  Negative for cough, shortness of breath and wheezing.   Cardiovascular:  Negative for chest pain, palpitations and leg swelling.  Gastrointestinal:  Negative for blood in stool, constipation, diarrhea, nausea and vomiting.  Endocrine: Negative for cold intolerance, heat intolerance and polyuria.  Genitourinary:  Negative for dyspareunia, dysuria, flank pain, frequency, genital sores, hematuria, menstrual problem, pelvic pain, urgency, vaginal bleeding, vaginal discharge and vaginal pain.  Musculoskeletal:  Negative for back pain, joint swelling and myalgias.  Skin:  Negative for rash.  Neurological:  Negative for dizziness, syncope, light-headedness, numbness and headaches.  Hematological:  Negative for adenopathy.  Psychiatric/Behavioral:  Negative for agitation, confusion, sleep disturbance and suicidal ideas. The patient is not nervous/anxious.    BREAST: No symptoms   Objective: BP 133/84   Pulse 69   Ht 5' 6 (1.676 m)   Wt 206 lb (93.4 kg)   LMP 07/10/2023 (Exact Date)   BMI 33.25 kg/m    Physical Exam Constitutional:      Appearance: She is well-developed.  Genitourinary:     Vulva normal.     Right Labia: No rash, tenderness or lesions.    Left Labia: No tenderness, lesions or rash.    No vaginal discharge, erythema or tenderness.      Right Adnexa: not tender and no mass  present.    Left Adnexa: not tender and no mass present.    No cervical friability or polyp.     Uterus is not enlarged or tender.  Breasts:    Right: No mass, nipple discharge, skin change or tenderness.     Left: No mass, nipple discharge, skin change or tenderness.  Neck:     Thyroid: No thyromegaly.  Cardiovascular:     Rate and Rhythm: Normal rate and regular rhythm.     Heart sounds: Normal heart sounds. No murmur heard. Pulmonary:     Effort: Pulmonary effort is normal.  Breath sounds: Normal breath sounds.  Abdominal:     Palpations: Abdomen is soft.     Tenderness: There is no abdominal tenderness. There is no guarding or rebound.  Musculoskeletal:        General: Normal range of motion.     Cervical back: Normal range of motion.  Lymphadenopathy:     Cervical: No cervical adenopathy.  Neurological:     General: No focal deficit present.     Mental Status: She is alert and oriented to person, place, and time.     Cranial Nerves: No cranial nerve deficit.  Skin:    General: Skin is warm and dry.  Psychiatric:        Mood and Affect: Mood normal.        Behavior: Behavior normal.        Thought Content: Thought content normal.        Judgment: Judgment normal.  Vitals reviewed.    Assessment/Plan: Encounter for annual routine gynecological examination  Cervical cancer screening - Plan: Cytology - PAP  Screening for HPV (human papillomavirus) - Plan: Cytology - PAP  Encounter for surveillance of contraceptive pills - Plan: norethindrone  (MICRONOR ) 0.35 MG tablet; OCP change due to hx hx of HTN now. Prog only options discussed, pt wants pills. F/u prn. Aware of condoms/abstinence 7 days if last by 3 hrs.   Essential hypertension - Plan: norethindrone  (MICRONOR ) 0.35 MG tablet; change to POPs from OCPs  Meds ordered this encounter  Medications   norethindrone  (MICRONOR ) 0.35 MG tablet    Sig: Take 1 tablet (0.35 mg total) by mouth daily.    Dispense:  84  tablet    Refill:  3    Supervising Provider:   ROBY, MICIA [[id number]]             GYN counsel adequate intake of calcium and vitamin D, diet and exercise     F/U  Return in about 1 year (around 07/20/2024).  Teandra Harlan B. Rahman Ferrall, PA-C 07/21/2023 2:16 PM

## 2023-07-27 LAB — CYTOLOGY - PAP
Adequacy: ABSENT
Comment: NEGATIVE
Diagnosis: NEGATIVE
High risk HPV: NEGATIVE

## 2023-08-24 ENCOUNTER — Telehealth: Payer: Self-pay

## 2023-08-24 NOTE — Telephone Encounter (Signed)
 Rx already sent to [facility]

## 2023-08-24 NOTE — Telephone Encounter (Signed)
 SABRA

## 2023-10-15 ENCOUNTER — Other Ambulatory Visit: Payer: Self-pay | Admitting: Family

## 2023-10-15 DIAGNOSIS — N1330 Unspecified hydronephrosis: Secondary | ICD-10-CM

## 2023-10-15 NOTE — Telephone Encounter (Signed)
 Copied from CRM #[id number]. Topic: Clinical - Medication Refill >> Oct 15, 2023  9:09 AM Rea ORN wrote: Medication: amLODipine (NORVASC) 5 MG tablet Pt stated she needed to check with nephrologist before continuing this medication. Her nephrologist, Dr. Waldemar Edman, stated it was ok for her to take this.   Has the patient contacted their pharmacy? Yes (Agent: If no, request that the patient contact the pharmacy for the refill. If patient does not wish to contact the pharmacy document the reason why and proceed with request.) (Agent: If yes, when and what did the pharmacy advise?)  This is the patient's preferred pharmacy:  [facility] [address] ([address]), [place] - [address] [place]) [place] [address] Phone: [phone] Fax: [phone]  Is this the correct pharmacy for this prescription? Yes If no, delete pharmacy and type the correct one.   Has the prescription been filled recently? No  Is the patient out of the medication? Yes  Has the patient been seen for an appointment in the last year OR does the patient have an upcoming appointment? No  Can we respond through MyChart? Yes  Agent: Please be advised that Rx refills may take up to 3 business days. We ask that you follow-up with your pharmacy.

## 2023-10-16 ENCOUNTER — Other Ambulatory Visit: Payer: Self-pay | Admitting: Family

## 2023-10-16 DIAGNOSIS — N1330 Unspecified hydronephrosis: Secondary | ICD-10-CM

## 2023-10-16 NOTE — Telephone Encounter (Signed)
 Historical medication Last OV: 06/18/2022 Next OV: 11/03/2023

## 2023-10-16 NOTE — Telephone Encounter (Unsigned)
 Copied from CRM [id number]. Topic: Clinical - Medication Refill >> Oct 16, 2023  1:20 PM Tanazia G wrote: Medication: amLODipine (NORVASC) 5 MG tablet     Has the patient contacted their pharmacy? Yes (Agent: If no, request that the patient contact the pharmacy for the refill. If patient does not wish to contact the pharmacy document the reason why and proceed with request.) (Agent: If yes, when and what did the pharmacy advise?)  This is the patient's preferred pharmacy:  [facility] [address] ([address]), [place] - [address] [place]) [place] [address] Phone: [phone] Fax: [phone]  Is this the correct pharmacy for this prescription? Yes If no, delete pharmacy and type the correct one.   Has the prescription been filled recently? Yes  Is the patient out of the medication? Yes  Has the patient been seen for an appointment in the last year OR does the patient have an upcoming appointment? Yes  Can we respond through MyChart? Yes  Agent: Please be advised that Rx refills may take up to 3 business days. We ask that you follow-up with your pharmacy.

## 2023-10-20 NOTE — Telephone Encounter (Signed)
 Called pt she stated she already picked up this medication

## 2023-11-03 ENCOUNTER — Ambulatory Visit: Admitting: Family

## 2023-11-10 ENCOUNTER — Encounter: Payer: Self-pay | Admitting: Family

## 2023-11-10 ENCOUNTER — Ambulatory Visit: Admitting: Family

## 2023-11-10 VITALS — BP 129/80 | HR 90 | Temp 98.3°F | Ht 66.0 in | Wt 209.4 lb

## 2023-11-10 DIAGNOSIS — N181 Chronic kidney disease, stage 1: Secondary | ICD-10-CM | POA: Diagnosis not present

## 2023-11-10 DIAGNOSIS — Z0000 Encounter for general adult medical examination without abnormal findings: Secondary | ICD-10-CM

## 2023-11-10 DIAGNOSIS — R030 Elevated blood-pressure reading, without diagnosis of hypertension: Secondary | ICD-10-CM | POA: Diagnosis not present

## 2023-11-10 DIAGNOSIS — N1330 Unspecified hydronephrosis: Secondary | ICD-10-CM

## 2023-11-10 DIAGNOSIS — I159 Secondary hypertension, unspecified: Secondary | ICD-10-CM | POA: Diagnosis not present

## 2023-11-10 MED ORDER — AMLODIPINE BESYLATE 5 MG PO TABS: 5.0000 mg | ORAL_TABLET | Freq: Every day | ORAL | 3 refills | Status: AC

## 2023-11-10 NOTE — Progress Notes (Signed)
 Assessment & Plan:  Secondary hypertension Assessment & Plan: Chronic, stable.  Continue amlodipine 5 mg daily.   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  She continues following with nephrology.  Will follow.  Blood pressure under excellent control  Orders: -     Comprehensive metabolic panel with GFR; Future  Routine general medical examination at a health care facility -     Lipid panel; Future  Hydronephrosis, unspecified hydronephrosis type -     amLODIPine Besylate; Take 1 tablet (5 mg total) by mouth daily.  Dispense: 90 tablet; Refill: 3     Return precautions given.   Risks, benefits, and alternatives of the medications and treatment plan prescribed today were discussed, and patient expressed understanding.   Education regarding symptom management and diagnosis given to patient on AVS either electronically or printed.  Return in about 1 year (around 11/09/2024) for Fasting labs in 2-3 weeks.  Courtney Northern, FNP  Subjective:    Patient ID: Courtney Johnston, female    DOB: Sep 11, 1990, 33 y.o.   MRN: [id number]  CC: Courtney Johnston is a 33 y.o. female who presents today for follow up.   HPI: HPI Discussed the use of AI scribe software for clinical note transcription with the patient, who gave verbal consent to proceed.  History of Present Illness   Courtney Johnston is a 33 year old female with hypertension who presents for a follow-up visit. She has no concerns today  She has been receiving regular nephrology care every six months. Her current medication regimen includes amlodipine 5 mg daily for blood pressure management, with no recent changes.  She recently switched her birth control method to a progesterone-only pill due to her hypertension medication. She takes it consistently at  the same time each day .   Following with nephrology last seen 07/07/2023 with single right kidney  Allergies: Patient has no known allergies. Current Outpatient Medications on File Prior to Visit  Medication Sig Dispense Refill   acetaminophen  (TYLENOL ) 500 MG tablet Take by mouth.     norethindrone  (MICRONOR ) 0.35 MG tablet Take 1 tablet (0.35 mg total) by mouth daily. 84 tablet 3   No current facility-administered medications on file prior to visit.    Review of Systems  Constitutional:  Negative for chills and fever.  Respiratory:  Negative for cough.   Cardiovascular:  Negative for chest pain and palpitations.  Gastrointestinal:  Negative for nausea and vomiting.      Objective:    BP 129/80   Pulse 90   Temp 98.3 F (36.8 C) (Oral)   Ht 5' 6 (1.676 m)   Wt 209 lb 6.4 oz (95 kg)   SpO2 99%   BMI 33.80 kg/m  BP Readings from Last 3 Encounters:  11/10/23 129/80  07/21/23 133/84  10/16/22 (!) 140/80   Wt Readings from Last 3 Encounters:  11/10/23 209 lb 6.4 oz (95 kg)  07/21/23 206 lb (93.4 kg)  10/16/22 205 lb (93 kg)    Physical Exam Vitals reviewed.  Constitutional:      Appearance: She is well-developed.  Eyes:     Conjunctiva/sclera: Conjunctivae normal.  Cardiovascular:     Rate and Rhythm: Normal rate and regular rhythm.     Pulses: Normal pulses.     Heart sounds: Normal heart sounds.  Pulmonary:     Effort: Pulmonary effort is normal.     Breath sounds: Normal breath sounds. No wheezing, rhonchi or rales.  Skin:    General: Skin is warm and dry.  Neurological:     Mental Status: She is alert.  Psychiatric:        Speech: Speech normal.        Behavior: Behavior normal.        Thought Content: Thought content normal.

## 2023-11-10 NOTE — Patient Instructions (Signed)
 Monitor blood pressure at home and me 5-6 reading on separate days. Goal is less than 120/80, based on newest guidelines, however we certainly want to be less than 130/80;  if persistently higher, please make sooner follow up appointment so we can recheck you blood pressure and manage/ adjust medications.  Nice to see you! Managing Your Hypertension Hypertension, also called high blood pressure, is when the force of the blood pressing against the walls of the arteries is too strong. Arteries are blood vessels that carry blood from your heart throughout your body. Hypertension forces the heart to work harder to pump blood and may cause the arteries to become narrow or stiff. Understanding blood pressure readings A blood pressure reading includes a higher number over a lower number: The first, or top, number is called the systolic pressure. It is a measure of the pressure in your arteries as your heart beats. The second, or bottom number, is called the diastolic pressure. It is a measure of the pressure in your arteries as the heart relaxes. For most people, a normal blood pressure is below 120/80. Your personal target blood pressure may vary depending on your medical conditions, your age, and other factors. Blood pressure is classified into four stages. Based on your blood pressure reading, your health care provider may use the following stages to determine what type of treatment you need, if any. Systolic pressure and diastolic pressure are measured in a unit called millimeters of mercury (mmHg). Normal Systolic pressure: below 120. Diastolic pressure: below 80. Elevated Systolic pressure: 120-129. Diastolic pressure: below 80. Hypertension stage 1 Systolic pressure: 130-139. Diastolic pressure: 80-89. Hypertension stage 2 Systolic pressure: 140 or above. Diastolic pressure: 90 or above. How can this condition affect me? Managing your hypertension is very important. Over time, hypertension  can damage the arteries and decrease blood flow to parts of the body, including the brain, heart, and kidneys. Having untreated or uncontrolled hypertension can lead to: A heart attack. A stroke. A weakened blood vessel (aneurysm). Heart failure. Kidney damage. Eye damage. Memory and concentration problems. Vascular dementia. What actions can I take to manage this condition? Hypertension can be managed by making lifestyle changes and possibly by taking medicines. Your health care provider will help you make a plan to bring your blood pressure within a normal range. You may be referred for counseling on a healthy diet and physical activity. Nutrition  Eat a diet that is high in fiber and potassium, and low in salt (sodium), added sugar, and fat. An example eating plan is called the DASH diet. DASH stands for Dietary Approaches to Stop Hypertension. To eat this way: Eat plenty of fresh fruits and vegetables. Try to fill one-half of your plate at each meal with fruits and vegetables. Eat whole grains, such as whole-wheat pasta, brown rice, or whole-grain bread. Fill about one-fourth of your plate with whole grains. Eat low-fat dairy products. Avoid fatty cuts of meat, processed or cured meats, and poultry with skin. Fill about one-fourth of your plate with lean proteins such as fish, chicken without skin, beans, eggs, and tofu. Avoid pre-made and processed foods. These tend to be higher in sodium, added sugar, and fat. Reduce your daily sodium intake. Many people with hypertension should eat less than 1,500 mg of sodium a day. Lifestyle  Work with your health care provider to maintain a healthy body weight or to lose weight. Ask what an ideal weight is for you. Get at least 30 minutes of exercise  that causes your heart to beat faster (aerobic exercise) most days of the week. Activities may include walking, swimming, or biking. Include exercise to strengthen your muscles (resistance exercise),  such as weight lifting, as part of your weekly exercise routine. Try to do these types of exercises for 30 minutes at least 3 days a week. Do not use any products that contain nicotine or tobacco. These products include cigarettes, chewing tobacco, and vaping devices, such as e-cigarettes. If you need help quitting, ask your health care provider. Control any long-term (chronic) conditions you have, such as high cholesterol or diabetes. Identify your sources of stress and find ways to manage stress. This may include meditation, deep breathing, or making time for fun activities. Alcohol use Do not drink alcohol if: Your health care provider tells you not to drink. You are pregnant, may be pregnant, or are planning to become pregnant. If you drink alcohol: Limit how much you have to: 0-1 drink a day for women. 0-2 drinks a day for men. Know how much alcohol is in your drink. In the [place] one drink equals one 12 oz bottle of beer (355 mL), one 5 oz glass of wine (148 mL), or one 1 oz glass of hard liquor (44 mL). Medicines Your health care provider may prescribe medicine if lifestyle changes are not enough to get your blood pressure under control and if: Your systolic blood pressure is 130 or higher. Your diastolic blood pressure is 80 or higher. Take medicines only as told by your health care provider. Follow the directions carefully. Blood pressure medicines must be taken as told by your health care provider. The medicine does not work as well when you skip doses. Skipping doses also puts you at risk for problems. Monitoring Before you monitor your blood pressure: Do not smoke, drink caffeinated beverages, or exercise within 30 minutes before taking a measurement. Use the bathroom and empty your bladder (urinate). Sit quietly for at least 5 minutes before taking measurements. Monitor your blood pressure at home as told by your health care provider. To do this: Sit with your back straight and  supported. Place your feet flat on the floor. Do not cross your legs. Support your arm on a flat surface, such as a table. Make sure your upper arm is at heart level. Each time you measure, take two or three readings one minute apart and record the results. You may also need to have your blood pressure checked regularly by your health care provider. General information Talk with your health care provider about your diet, exercise habits, and other lifestyle factors that may be contributing to hypertension. Review all the medicines you take with your health care provider because there may be side effects or interactions. Keep all follow-up visits. Your health care provider can help you create and adjust your plan for managing your high blood pressure. Where to find more information National Heart, Lung, and Blood Institute: popsteam.is American Heart Association: www.heart.org Contact a health care provider if: You think you are having a reaction to medicines you have taken. You have repeated (recurrent) headaches. You feel dizzy. You have swelling in your ankles. You have trouble with your vision. Get help right away if: You develop a severe headache or confusion. You have unusual weakness or numbness, or you feel faint. You have severe pain in your chest or abdomen. You vomit repeatedly. You have trouble breathing. These symptoms may be an emergency. Get help right away. Call 911. Do not wait  to see if the symptoms will go away. Do not drive yourself to the hospital. Summary Hypertension is when the force of blood pumping through your arteries is too strong. If this condition is not controlled, it may put you at risk for serious complications. Your personal target blood pressure may vary depending on your medical conditions, your age, and other factors. For most people, a normal blood pressure is less than 120/80. Hypertension is managed by lifestyle changes, medicines, or  both. Lifestyle changes to help manage hypertension include losing weight, eating a healthy, low-sodium diet, exercising more, stopping smoking, and limiting alcohol. This information is not intended to replace advice given to you by your health care provider. Make sure you discuss any questions you have with your health care provider. Document Revised: 09/06/2020 Document Reviewed: 09/06/2020 Elsevier Patient Education  2024 [employer].

## 2023-11-10 NOTE — Assessment & Plan Note (Signed)
 Chronic, stable.  She continues following with nephrology.  Will follow.  Blood pressure under excellent control

## 2023-11-10 NOTE — Assessment & Plan Note (Signed)
Chronic, stable. Continue amlodipine 5mg daily. 

## 2023-11-24 ENCOUNTER — Other Ambulatory Visit

## 2023-11-24 DIAGNOSIS — R030 Elevated blood-pressure reading, without diagnosis of hypertension: Secondary | ICD-10-CM | POA: Diagnosis not present

## 2023-11-24 DIAGNOSIS — Z0000 Encounter for general adult medical examination without abnormal findings: Secondary | ICD-10-CM

## 2023-11-24 DIAGNOSIS — N181 Chronic kidney disease, stage 1: Secondary | ICD-10-CM | POA: Diagnosis not present

## 2023-11-24 LAB — COMPREHENSIVE METABOLIC PANEL WITH GFR
ALT: 24 U/L (ref 0–35)
AST: 21 U/L (ref 0–37)
Albumin: 4.5 g/dL (ref 3.5–5.2)
Alkaline Phosphatase: 81 U/L (ref 39–117)
BUN: 14 mg/dL (ref 6–23)
CO2: 25 meq/L (ref 19–32)
Calcium: 9 mg/dL (ref 8.4–10.5)
Chloride: 104 meq/L (ref 96–112)
Creatinine, Ser: 0.92 mg/dL (ref 0.40–1.20)
GFR: 80.73 mL/min (ref >60.00)
Glucose, Bld: 84 mg/dL (ref 70–99)
Potassium: 4.4 meq/L (ref 3.5–5.1)
Sodium: 138 meq/L (ref 135–145)
Total Bilirubin: 0.3 mg/dL (ref 0.2–1.2)
Total Protein: 7.1 g/dL (ref 6.0–8.3)

## 2023-11-24 LAB — LIPID PANEL
Cholesterol: 222 mg/dL — ABNORMAL HIGH (ref 0–200)
HDL: 56.3 mg/dL (ref >39.00)
LDL Cholesterol: 152 mg/dL — ABNORMAL HIGH (ref 0–99)
NonHDL: 166.15
Total CHOL/HDL Ratio: 4
Triglycerides: 73 mg/dL (ref 0.0–149.0)
VLDL: 14.6 mg/dL (ref 0.0–40.0)

## 2023-11-24 LAB — TSH: TSH: 1.56 u[IU]/mL (ref 0.35–5.50)

## 2023-11-26 ENCOUNTER — Ambulatory Visit: Payer: Self-pay | Admitting: Family

## 2024-11-15 ENCOUNTER — Encounter: Admitting: Family
